# Patient Record
Sex: Female | Born: 1960
Health system: Southern US, Community
[De-identification: ages and names within clinical notes are randomized; demographics above are authoritative.]

## PROBLEM LIST (undated history)

## (undated) DIAGNOSIS — M1990 Unspecified osteoarthritis, unspecified site: Secondary | ICD-10-CM

## (undated) DIAGNOSIS — Z5189 Encounter for other specified aftercare: Secondary | ICD-10-CM

## (undated) DIAGNOSIS — IMO0002 Reserved for concepts with insufficient information to code with codable children: Secondary | ICD-10-CM

## (undated) DIAGNOSIS — D219 Benign neoplasm of connective and other soft tissue, unspecified: Secondary | ICD-10-CM

## (undated) DIAGNOSIS — F419 Anxiety disorder, unspecified: Secondary | ICD-10-CM

## (undated) HISTORY — PX: KNEE ARTHROSCOPY: SUR90

## (undated) HISTORY — DX: Benign neoplasm of connective and other soft tissue, unspecified: D21.9

## (undated) HISTORY — DX: Reserved for concepts with insufficient information to code with codable children: IMO0002

## (undated) HISTORY — PX: WISDOM TOOTH EXTRACTION: SHX21

## (undated) HISTORY — DX: Unspecified osteoarthritis, unspecified site: M19.90

---

## 1994-09-27 DIAGNOSIS — Z5189 Encounter for other specified aftercare: Secondary | ICD-10-CM

## 1994-09-27 HISTORY — DX: Encounter for other specified aftercare: Z51.89

## 1999-02-10 ENCOUNTER — Other Ambulatory Visit: Admission: RE | Admit: 1999-02-10 | Discharge: 1999-02-10 | Payer: Self-pay | Admitting: Obstetrics and Gynecology

## 2000-02-29 ENCOUNTER — Other Ambulatory Visit: Admission: RE | Admit: 2000-02-29 | Discharge: 2000-02-29 | Payer: Self-pay | Admitting: Obstetrics and Gynecology

## 2001-03-31 ENCOUNTER — Other Ambulatory Visit: Admission: RE | Admit: 2001-03-31 | Discharge: 2001-03-31 | Payer: Self-pay | Admitting: Obstetrics and Gynecology

## 2002-05-15 ENCOUNTER — Other Ambulatory Visit: Admission: RE | Admit: 2002-05-15 | Discharge: 2002-05-15 | Payer: Self-pay | Admitting: Obstetrics and Gynecology

## 2003-06-18 ENCOUNTER — Other Ambulatory Visit: Admission: RE | Admit: 2003-06-18 | Discharge: 2003-06-18 | Payer: Self-pay | Admitting: Obstetrics and Gynecology

## 2004-08-19 ENCOUNTER — Other Ambulatory Visit: Admission: RE | Admit: 2004-08-19 | Discharge: 2004-08-19 | Payer: Self-pay | Admitting: Obstetrics and Gynecology

## 2005-10-21 ENCOUNTER — Other Ambulatory Visit: Admission: RE | Admit: 2005-10-21 | Discharge: 2005-10-21 | Payer: Self-pay | Admitting: Obstetrics & Gynecology

## 2006-12-08 ENCOUNTER — Other Ambulatory Visit: Admission: RE | Admit: 2006-12-08 | Discharge: 2006-12-08 | Payer: Self-pay | Admitting: Obstetrics and Gynecology

## 2007-02-01 ENCOUNTER — Encounter: Admission: RE | Admit: 2007-02-01 | Discharge: 2007-02-01 | Payer: Self-pay | Admitting: Obstetrics and Gynecology

## 2007-04-07 ENCOUNTER — Encounter: Payer: Self-pay | Admitting: Internal Medicine

## 2007-04-07 ENCOUNTER — Ambulatory Visit: Payer: Self-pay | Admitting: Internal Medicine

## 2007-04-07 DIAGNOSIS — F3289 Other specified depressive episodes: Secondary | ICD-10-CM | POA: Insufficient documentation

## 2007-04-07 DIAGNOSIS — F329 Major depressive disorder, single episode, unspecified: Secondary | ICD-10-CM | POA: Insufficient documentation

## 2008-02-27 ENCOUNTER — Other Ambulatory Visit: Admission: RE | Admit: 2008-02-27 | Discharge: 2008-02-27 | Payer: Self-pay | Admitting: Obstetrics & Gynecology

## 2008-03-01 ENCOUNTER — Encounter: Payer: Self-pay | Admitting: Internal Medicine

## 2008-03-01 ENCOUNTER — Encounter: Admission: RE | Admit: 2008-03-01 | Discharge: 2008-03-01 | Payer: Self-pay | Admitting: Obstetrics and Gynecology

## 2010-06-11 ENCOUNTER — Encounter: Admission: RE | Admit: 2010-06-11 | Discharge: 2010-06-11 | Payer: Self-pay | Admitting: Obstetrics & Gynecology

## 2010-09-27 HISTORY — PX: OTHER SURGICAL HISTORY: SHX169

## 2012-02-23 ENCOUNTER — Encounter (HOSPITAL_COMMUNITY): Payer: Self-pay | Admitting: Pharmacist

## 2012-02-26 HISTORY — PX: LAPAROSCOPIC TOTAL HYSTERECTOMY: SUR800

## 2012-02-26 HISTORY — PX: ABDOMINAL HYSTERECTOMY: SHX81

## 2012-02-28 ENCOUNTER — Inpatient Hospital Stay (HOSPITAL_COMMUNITY): Admission: RE | Admit: 2012-02-28 | Payer: Self-pay | Source: Ambulatory Visit

## 2012-03-01 ENCOUNTER — Encounter (HOSPITAL_COMMUNITY): Payer: Self-pay

## 2012-03-01 ENCOUNTER — Encounter (HOSPITAL_COMMUNITY)
Admission: RE | Admit: 2012-03-01 | Discharge: 2012-03-01 | Disposition: A | Discharge status: Home / Self Care | Payer: 59 | Source: Ambulatory Visit | Attending: Obstetrics & Gynecology | Admitting: Obstetrics & Gynecology

## 2012-03-01 HISTORY — DX: Anxiety disorder, unspecified: F41.9

## 2012-03-01 HISTORY — DX: Encounter for other specified aftercare: Z51.89

## 2012-03-01 LAB — CBC
HCT: 35 % — ABNORMAL LOW (ref 36.0–46.0)
Hemoglobin: 11.7 g/dL — ABNORMAL LOW (ref 12.0–15.0)
MCH: 29.2 pg (ref 26.0–34.0)
MCHC: 33.4 g/dL (ref 30.0–36.0)
MCV: 87.3 fL (ref 78.0–100.0)

## 2012-03-01 NOTE — Patient Instructions (Addendum)
   Your procedure is scheduled on: Monday June 10th  Enter through the Main Entrance of [facility] at:6am Pick up the phone at the desk and dial [phone] and inform us of your arrival.  Please call this number if you have any problems the morning of surgery: [phone]  Remember: Do not eat food after midnight: Sunday Do not drink clear liquids after: midnight Sunday Take these medicines the morning of surgery with a SIP OF WATER:  Do not wear jewelry, make-up, or FINGER nail polish Do not wear lotions, powders, perfumes or deodorant. Do not shave 48 hours prior to surgery. Do not bring valuables to the hospital. Contacts, dentures or bridgework may not be worn into surgery.  Leave suitcase in the car. After Surgery it may be brought to your room. For patients being admitted to the hospital, checkout time is 11:00am the day of discharge.  Patients discharged on the day of surgery will not be allowed to drive home.     Remember to use your hibiclens as instructed.Please shower with 1/2 bottle the evening before your surgery and the other 1/2 bottle the morning of surgery. Neck down avoiding private area.

## 2012-03-05 MED ORDER — CLINDAMYCIN PHOSPHATE 900 MG/50ML IV SOLN
900.0000 mg | INTRAVENOUS | Status: DC
  Filled 2012-03-05: qty 50

## 2012-03-05 MED ORDER — CIPROFLOXACIN IN D5W 400 MG/200ML IV SOLN
400.0000 mg | INTRAVENOUS | Status: DC
  Filled 2012-03-05: qty 200

## 2012-03-06 ENCOUNTER — Ambulatory Visit (HOSPITAL_COMMUNITY): Payer: 59 | Admitting: Anesthesiology

## 2012-03-06 ENCOUNTER — Ambulatory Visit (HOSPITAL_COMMUNITY)
Admission: RE | Admit: 2012-03-06 | Discharge: 2012-03-07 | Disposition: A | Discharge status: Home / Self Care | Payer: 59 | Source: Ambulatory Visit | Attending: Obstetrics & Gynecology | Admitting: Obstetrics & Gynecology

## 2012-03-06 ENCOUNTER — Encounter (HOSPITAL_COMMUNITY): Payer: Self-pay | Admitting: *Deleted

## 2012-03-06 ENCOUNTER — Encounter (HOSPITAL_COMMUNITY): Payer: Self-pay | Admitting: Anesthesiology

## 2012-03-06 ENCOUNTER — Encounter (HOSPITAL_COMMUNITY): Payer: Self-pay

## 2012-03-06 ENCOUNTER — Encounter (HOSPITAL_COMMUNITY)
Admission: RE | Disposition: A | Discharge status: Home / Self Care | Payer: Self-pay | Source: Ambulatory Visit | Attending: Obstetrics & Gynecology

## 2012-03-06 DIAGNOSIS — N8000 Endometriosis of the uterus, unspecified: Secondary | ICD-10-CM | POA: Insufficient documentation

## 2012-03-06 DIAGNOSIS — Y9210 Unspecified residential institution as the place of occurrence of the external cause: Secondary | ICD-10-CM | POA: Insufficient documentation

## 2012-03-06 DIAGNOSIS — Z01818 Encounter for other preprocedural examination: Secondary | ICD-10-CM | POA: Insufficient documentation

## 2012-03-06 DIAGNOSIS — N838 Other noninflammatory disorders of ovary, fallopian tube and broad ligament: Secondary | ICD-10-CM | POA: Insufficient documentation

## 2012-03-06 DIAGNOSIS — IMO0002 Reserved for concepts with insufficient information to code with codable children: Secondary | ICD-10-CM | POA: Insufficient documentation

## 2012-03-06 DIAGNOSIS — Z01812 Encounter for preprocedural laboratory examination: Secondary | ICD-10-CM | POA: Insufficient documentation

## 2012-03-06 DIAGNOSIS — D251 Intramural leiomyoma of uterus: Secondary | ICD-10-CM | POA: Diagnosis present

## 2012-03-06 DIAGNOSIS — D649 Anemia, unspecified: Secondary | ICD-10-CM | POA: Insufficient documentation

## 2012-03-06 DIAGNOSIS — N920 Excessive and frequent menstruation with regular cycle: Secondary | ICD-10-CM | POA: Insufficient documentation

## 2012-03-06 HISTORY — PX: CYSTOSCOPY: SHX5120

## 2012-03-06 LAB — CBC
HCT: 36 % (ref 36.0–46.0)
Hemoglobin: 12.1 g/dL (ref 12.0–15.0)
MCH: 29.5 pg (ref 26.0–34.0)
MCV: 87.8 fL (ref 78.0–100.0)
Platelets: 211 10*3/uL (ref 150–400)
RBC: 4.1 MIL/uL (ref 3.87–5.11)
WBC: 9.7 10*3/uL (ref 4.0–10.5)

## 2012-03-06 LAB — PREGNANCY, URINE: Preg Test, Ur: NEGATIVE

## 2012-03-06 SURGERY — ROBOTIC ASSISTED TOTAL HYSTERECTOMY
Anesthesia: Epidural | Site: Bladder | Wound class: Clean Contaminated

## 2012-03-06 MED ORDER — NEOSTIGMINE METHYLSULFATE 1 MG/ML IJ SOLN
INTRAMUSCULAR | Status: AC
  Filled 2012-03-06: qty 10

## 2012-03-06 MED ORDER — DEXAMETHASONE SODIUM PHOSPHATE 10 MG/ML IJ SOLN
INTRAMUSCULAR | Status: DC | PRN
  Administered 2012-03-06: 10 mg via INTRAVENOUS

## 2012-03-06 MED ORDER — ALUM & MAG HYDROXIDE-SIMETH 200-200-20 MG/5ML PO SUSP
30.0000 mL | ORAL | Status: DC | PRN
  Filled 2012-03-06: qty 30

## 2012-03-06 MED ORDER — ONDANSETRON HCL 4 MG/2ML IJ SOLN
INTRAMUSCULAR | Status: AC
  Filled 2012-03-06: qty 2

## 2012-03-06 MED ORDER — LIDOCAINE HCL (CARDIAC) 20 MG/ML IV SOLN
INTRAVENOUS | Status: DC | PRN
  Administered 2012-03-06: 80 mg via INTRAVENOUS

## 2012-03-06 MED ORDER — NEOSTIGMINE METHYLSULFATE 1 MG/ML IJ SOLN
INTRAMUSCULAR | Status: DC | PRN
  Administered 2012-03-06: 4 mg via INTRAVENOUS

## 2012-03-06 MED ORDER — ROPIVACAINE HCL 5 MG/ML IJ SOLN
INTRAMUSCULAR | Status: AC
  Filled 2012-03-06: qty 60

## 2012-03-06 MED ORDER — INDIGOTINDISULFONATE SODIUM 8 MG/ML IJ SOLN
INTRAMUSCULAR | Status: DC | PRN
  Administered 2012-03-06: 40 mg via INTRAVENOUS

## 2012-03-06 MED ORDER — STERILE WATER FOR IRRIGATION IR SOLN
Status: DC | PRN
  Administered 2012-03-06: 1000 mL via INTRAVESICAL

## 2012-03-06 MED ORDER — MIDAZOLAM HCL 5 MG/5ML IJ SOLN
INTRAMUSCULAR | Status: DC | PRN
  Administered 2012-03-06: 2 mg via INTRAVENOUS

## 2012-03-06 MED ORDER — CEFAZOLIN SODIUM-DEXTROSE 2-3 GM-% IV SOLR
2.0000 g | Freq: Three times a day (TID) | INTRAVENOUS | Status: DC
  Administered 2012-03-06: 2 g via INTRAVENOUS
  Filled 2012-03-06 (×6): qty 50

## 2012-03-06 MED ORDER — KETOROLAC TROMETHAMINE 30 MG/ML IJ SOLN
15.0000 mg | Freq: Once | INTRAMUSCULAR | Status: AC | PRN
  Administered 2012-03-06: 30 mg via INTRAVENOUS

## 2012-03-06 MED ORDER — FENTANYL CITRATE 0.05 MG/ML IJ SOLN
25.0000 ug | INTRAMUSCULAR | Status: DC | PRN
  Administered 2012-03-06: 25 ug via INTRAVENOUS

## 2012-03-06 MED ORDER — CEFAZOLIN SODIUM 1-5 GM-% IV SOLN
INTRAVENOUS | Status: AC
  Filled 2012-03-06: qty 50

## 2012-03-06 MED ORDER — PROPOFOL 10 MG/ML IV EMUL
INTRAVENOUS | Status: DC | PRN
  Administered 2012-03-06: 150 mg via INTRAVENOUS

## 2012-03-06 MED ORDER — MENTHOL 3 MG MT LOZG
1.0000 | LOZENGE | OROMUCOSAL | Status: DC | PRN
  Filled 2012-03-06: qty 9

## 2012-03-06 MED ORDER — TEMAZEPAM 15 MG PO CAPS
15.0000 mg | ORAL_CAPSULE | Freq: Every evening | ORAL | Status: DC | PRN
  Administered 2012-03-06: 15 mg via ORAL
  Filled 2012-03-06: qty 1

## 2012-03-06 MED ORDER — LACTATED RINGERS IR SOLN
Status: DC | PRN
  Administered 2012-03-06: 3000 mL

## 2012-03-06 MED ORDER — LACTATED RINGERS IV SOLN
INTRAVENOUS | Status: DC
  Administered 2012-03-06: 50 mL/h via INTRAVENOUS
  Administered 2012-03-06: 10:00:00 via INTRAVENOUS

## 2012-03-06 MED ORDER — INDIGOTINDISULFONATE SODIUM 8 MG/ML IJ SOLN
INTRAMUSCULAR | Status: AC
  Filled 2012-03-06: qty 5

## 2012-03-06 MED ORDER — ROCURONIUM BROMIDE 50 MG/5ML IV SOLN
INTRAVENOUS | Status: AC
  Filled 2012-03-06: qty 2

## 2012-03-06 MED ORDER — GLYCOPYRROLATE 0.2 MG/ML IJ SOLN
INTRAMUSCULAR | Status: AC
  Filled 2012-03-06: qty 3

## 2012-03-06 MED ORDER — DEXTROSE-NACL 5-0.45 % IV SOLN
INTRAVENOUS | Status: DC
  Administered 2012-03-06: 18:00:00 via INTRAVENOUS

## 2012-03-06 MED ORDER — PANTOPRAZOLE SODIUM 40 MG IV SOLR
40.0000 mg | Freq: Every day | INTRAVENOUS | Status: DC
  Administered 2012-03-06: 40 mg via INTRAVENOUS
  Filled 2012-03-06 (×2): qty 40

## 2012-03-06 MED ORDER — DEXAMETHASONE SODIUM PHOSPHATE 10 MG/ML IJ SOLN
INTRAMUSCULAR | Status: AC
  Filled 2012-03-06: qty 1

## 2012-03-06 MED ORDER — SIMETHICONE 80 MG PO CHEW: 80.0000 mg | CHEWABLE_TABLET | Freq: Four times a day (QID) | ORAL | Status: DC | PRN

## 2012-03-06 MED ORDER — ACETAMINOPHEN 325 MG PO TABS: 650.0000 mg | ORAL_TABLET | ORAL | Status: DC | PRN

## 2012-03-06 MED ORDER — ROPIVACAINE HCL 5 MG/ML IJ SOLN
INTRAMUSCULAR | Status: DC | PRN
  Administered 2012-03-06: 60 mL

## 2012-03-06 MED ORDER — MIDAZOLAM HCL 2 MG/2ML IJ SOLN
INTRAMUSCULAR | Status: AC
  Filled 2012-03-06: qty 2

## 2012-03-06 MED ORDER — GLYCOPYRROLATE 0.2 MG/ML IJ SOLN
INTRAMUSCULAR | Status: DC | PRN
  Administered 2012-03-06: 0.1 mg via INTRAVENOUS
  Administered 2012-03-06: .8 mg via INTRAVENOUS

## 2012-03-06 MED ORDER — MORPHINE SULFATE 4 MG/ML IJ SOLN
1.0000 mg | INTRAMUSCULAR | Status: DC | PRN
  Administered 2012-03-06 (×2): 2 mg via INTRAVENOUS
  Filled 2012-03-06: qty 1

## 2012-03-06 MED ORDER — PROPOFOL 10 MG/ML IV EMUL
INTRAVENOUS | Status: AC
  Filled 2012-03-06: qty 20

## 2012-03-06 MED ORDER — MEPERIDINE HCL 25 MG/ML IJ SOLN: 6.2500 mg | INTRAMUSCULAR | Status: DC | PRN

## 2012-03-06 MED ORDER — ARTIFICIAL TEARS OP OINT
TOPICAL_OINTMENT | OPHTHALMIC | Status: DC | PRN
  Administered 2012-03-06: 1 via OPHTHALMIC

## 2012-03-06 MED ORDER — LIDOCAINE HCL (CARDIAC) 20 MG/ML IV SOLN
INTRAVENOUS | Status: AC
  Filled 2012-03-06: qty 5

## 2012-03-06 MED ORDER — OXYCODONE-ACETAMINOPHEN 5-325 MG PO TABS: 1.0000 | ORAL_TABLET | ORAL | Status: DC | PRN

## 2012-03-06 MED ORDER — FENTANYL CITRATE 0.05 MG/ML IJ SOLN
INTRAMUSCULAR | Status: AC
  Filled 2012-03-06: qty 5

## 2012-03-06 MED ORDER — KETOROLAC TROMETHAMINE 30 MG/ML IJ SOLN: 30.0000 mg | Freq: Four times a day (QID) | INTRAMUSCULAR | Status: DC

## 2012-03-06 MED ORDER — FENTANYL CITRATE 0.05 MG/ML IJ SOLN
INTRAMUSCULAR | Status: DC | PRN
  Administered 2012-03-06: 50 ug via INTRAVENOUS
  Administered 2012-03-06: 100 ug via INTRAVENOUS
  Administered 2012-03-06 (×2): 50 ug via INTRAVENOUS

## 2012-03-06 MED ORDER — BUPROPION HCL ER (SR) 100 MG PO TB12
200.0000 mg | ORAL_TABLET | Freq: Two times a day (BID) | ORAL | Status: DC
  Filled 2012-03-06 (×3): qty 2

## 2012-03-06 MED ORDER — FENTANYL CITRATE 0.05 MG/ML IJ SOLN
INTRAMUSCULAR | Status: AC
  Filled 2012-03-06: qty 2

## 2012-03-06 MED ORDER — ACETAMINOPHEN 10 MG/ML IV SOLN
1000.0000 mg | Freq: Once | INTRAVENOUS | Status: AC
  Administered 2012-03-06: 1000 mg via INTRAVENOUS
  Filled 2012-03-06: qty 100

## 2012-03-06 MED ORDER — ONDANSETRON HCL 4 MG/2ML IJ SOLN
INTRAMUSCULAR | Status: DC | PRN
  Administered 2012-03-06: 4 mg via INTRAVENOUS

## 2012-03-06 MED ORDER — ONDANSETRON HCL 4 MG/2ML IJ SOLN: 4.0000 mg | Freq: Once | INTRAMUSCULAR | Status: DC | PRN

## 2012-03-06 MED ORDER — SODIUM CHLORIDE 0.9 % IJ SOLN
INTRAMUSCULAR | Status: DC | PRN
  Administered 2012-03-06: 60 mL

## 2012-03-06 MED ORDER — KETOROLAC TROMETHAMINE 30 MG/ML IJ SOLN
INTRAMUSCULAR | Status: AC
  Filled 2012-03-06: qty 1

## 2012-03-06 MED ORDER — KETOROLAC TROMETHAMINE 30 MG/ML IJ SOLN
30.0000 mg | Freq: Four times a day (QID) | INTRAMUSCULAR | Status: DC
  Administered 2012-03-06: 30 mg via INTRAVENOUS
  Filled 2012-03-06: qty 1

## 2012-03-06 MED ORDER — ROCURONIUM BROMIDE 100 MG/10ML IV SOLN
INTRAVENOUS | Status: DC | PRN
  Administered 2012-03-06: 50 mg via INTRAVENOUS
  Administered 2012-03-06 (×2): 10 mg via INTRAVENOUS

## 2012-03-06 SURGICAL SUPPLY — 72 items
ADH SKN CLS APL DERMABOND .7 (GAUZE/BANDAGES/DRESSINGS) ×2
APL SKNCLS STERI-STRIP NONHPOA (GAUZE/BANDAGES/DRESSINGS)
BAG URINE DRAINAGE (UROLOGICAL SUPPLIES) ×3 IMPLANT
BARRIER ADHS 3X4 INTERCEED (GAUZE/BANDAGES/DRESSINGS) ×3 IMPLANT
BENZOIN TINCTURE PRP APPL 2/3 (GAUZE/BANDAGES/DRESSINGS) ×1 IMPLANT
BRR ADH 4X3 ABS CNTRL BYND (GAUZE/BANDAGES/DRESSINGS) ×2
CABLE HIGH FREQUENCY MONO STRZ (ELECTRODE) ×3 IMPLANT
CATH FOLEY 3WAY  5CC 16FR (CATHETERS) ×1
CATH FOLEY 3WAY 5CC 16FR (CATHETERS) ×2 IMPLANT
CHLORAPREP W/TINT 26ML (MISCELLANEOUS) ×3 IMPLANT
CLOTH BEACON ORANGE TIMEOUT ST (SAFETY) ×3 IMPLANT
CONT PATH 16OZ SNAP LID 3702 (MISCELLANEOUS) ×3 IMPLANT
COVER MAYO STAND STRL (DRAPES) ×3 IMPLANT
COVER TABLE BACK 60X90 (DRAPES) ×6 IMPLANT
COVER TIP SHEARS 8 DVNC (MISCELLANEOUS) ×2 IMPLANT
COVER TIP SHEARS 8MM DA VINCI (MISCELLANEOUS) ×1
DECANTER SPIKE VIAL GLASS SM (MISCELLANEOUS) ×3 IMPLANT
DERMABOND ADVANCED (GAUZE/BANDAGES/DRESSINGS) ×1
DERMABOND ADVANCED .7 DNX12 (GAUZE/BANDAGES/DRESSINGS) ×2 IMPLANT
DRAPE HUG U DISPOSABLE (DRAPE) ×3 IMPLANT
DRAPE LG THREE QUARTER DISP (DRAPES) ×6 IMPLANT
DRAPE MONITOR DA VINCI (DRAPE) IMPLANT
DRAPE WARM FLUID 44X44 (DRAPE) ×3 IMPLANT
ELECT REM PT RETURN 9FT ADLT (ELECTROSURGICAL) ×3
ELECTRODE REM PT RTRN 9FT ADLT (ELECTROSURGICAL) ×2 IMPLANT
EVACUATOR SMOKE 8.L (FILTER) ×3 IMPLANT
GAUZE VASELINE 3X9 (GAUZE/BANDAGES/DRESSINGS) IMPLANT
GLOVE BIOGEL PI IND STRL 6.5 (GLOVE) ×6 IMPLANT
GLOVE BIOGEL PI IND STRL 7.0 (GLOVE) ×4 IMPLANT
GLOVE BIOGEL PI INDICATOR 6.5 (GLOVE) ×6
GLOVE BIOGEL PI INDICATOR 7.0 (GLOVE) ×2
GLOVE ECLIPSE 6.0 STRL STRAW (GLOVE) ×8 IMPLANT
GLOVE ECLIPSE 6.5 STRL STRAW (GLOVE) ×9 IMPLANT
GLOVE SURG SS PI 6.0 STRL IVOR (GLOVE) ×6 IMPLANT
GOWN STRL REIN XL XLG (GOWN DISPOSABLE) ×20 IMPLANT
KIT ACCESSORY DA VINCI DISP (KITS) ×1
KIT ACCESSORY DVNC DISP (KITS) ×2 IMPLANT
KIT DISP ACCESSORY 4 ARM (KITS) IMPLANT
NEEDLE INSUFFLATION 14GA 120MM (NEEDLE) ×3 IMPLANT
OCCLUDER COLPOPNEUMO (BALLOONS) ×1 IMPLANT
PACK LAVH (CUSTOM PROCEDURE TRAY) ×3 IMPLANT
PAD OB MATERNITY 4.3X12.25 (PERSONAL CARE ITEMS) ×3 IMPLANT
PAD PREP 24X48 CUFFED NSTRL (MISCELLANEOUS) ×6 IMPLANT
PLUG CATH AND CAP STER (CATHETERS) ×3 IMPLANT
PROTECTOR NERVE ULNAR (MISCELLANEOUS) ×6 IMPLANT
SET CYSTO W/LG BORE CLAMP LF (SET/KITS/TRAYS/PACK) ×3 IMPLANT
SET IRRIG TUBING LAPAROSCOPIC (IRRIGATION / IRRIGATOR) ×3 IMPLANT
SOLUTION ELECTROLUBE (MISCELLANEOUS) ×3 IMPLANT
SPONGE LAP 18X18 X RAY DECT (DISPOSABLE) IMPLANT
STRIP CLOSURE SKIN 1/4X4 (GAUZE/BANDAGES/DRESSINGS) IMPLANT
SUT VIC AB 0 CT1 27 (SUTURE) ×6
SUT VIC AB 0 CT1 27XBRD ANBCTR (SUTURE) ×4 IMPLANT
SUT VIC AB 2-0 SH 27 (SUTURE) ×6
SUT VIC AB 2-0 SH 27XBRD (SUTURE) IMPLANT
SUT VICRYL 0 UR6 27IN ABS (SUTURE) ×3 IMPLANT
SUT VICRYL RAPIDE 4/0 PS 2 (SUTURE) ×2 IMPLANT
SUT VLOC 180 0 9IN  GS21 (SUTURE) ×1
SUT VLOC 180 0 9IN GS21 (SUTURE) IMPLANT
SYR 50ML LL SCALE MARK (SYRINGE) ×3 IMPLANT
SYSTEM CONVERTIBLE TROCAR (TROCAR) IMPLANT
TIP UTERINE 5.1X6CM LAV DISP (MISCELLANEOUS) IMPLANT
TIP UTERINE 6.7X10CM GRN DISP (MISCELLANEOUS) IMPLANT
TIP UTERINE 6.7X6CM WHT DISP (MISCELLANEOUS) IMPLANT
TIP UTERINE 6.7X8CM BLUE DISP (MISCELLANEOUS) ×2 IMPLANT
TOWEL OR 17X24 6PK STRL BLUE (TOWEL DISPOSABLE) ×6 IMPLANT
TROCAR DISP BLADELESS 8 DVNC (TROCAR) ×2 IMPLANT
TROCAR DISP BLADELESS 8MM (TROCAR) ×1
TROCAR XCEL NON-BLD 5MMX100MML (ENDOMECHANICALS) ×3 IMPLANT
TROCAR Z-THREAD 12X150 (TROCAR) ×3 IMPLANT
TUBING FILTER THERMOFLATOR (ELECTROSURGICAL) ×3 IMPLANT
WARMER LAPAROSCOPE (MISCELLANEOUS) ×3 IMPLANT
WATER STERILE IRR 1000ML POUR (IV SOLUTION) ×9 IMPLANT

## 2012-03-06 NOTE — Op Note (Signed)
03/06/2012  10:16 AM  PATIENT:  Meagan Harrington  51 y.o. female  PRE-OPERATIVE DIAGNOSIS:  menorrhagia and fibroids  POST-OPERATIVE DIAGNOSIS:  menorrhagia and fibroids, probable adenomyosis  PROCEDURE:  Procedure(s): ROBOTIC ASSISTED TOTAL HYSTERECTOMY BILATERAL SALPINGECTOMY CYSTOSCOPY VAGINAL LACERATION REPAIR  SURGEON:  Jaycey Gens SUZANNE  ASSISTANTS: ROMINE, CYNTHIA   ANESTHESIA:   general, Dr. Rodman Pickle oversaw the case  ESTIMATED BLOOD LOSS:  100cc  BLOOD ADMINISTERED:none   FLUIDS: 1000cc LR  UOP: 150cc clear  SPECIMEN:  Uterus, cervix, bilateral tubes  DISPOSITION OF SPECIMEN:  PATHOLOGY  FINDINGS: enlarged and globular uterus.  The uterus was also very boggy consistent with adenomyosis.  Normal upper abdomen including normal liver edge, gall bladder, and stomach edge.  No evidence of endometriosis or adhesions.  DESCRIPTION OF OPERATION: Patient was taken to the operating room. She is placed in the supine position and the patient was positioned on the beanbag. General endotracheal anesthesia was a minister by the anesthesia staff without difficulty. SCDs were on her lower extremities and functioning properly. The arms were tucked by the side.  Legs are positioned in the low lithotomy position in [place] stirrups. The beanbag was inflated with good support around the neck and shoulders. A timeout was performed. Chlor prep was used to prep the abdomen and Betadine was used to prep the inner thighs perineum and vagina x3. After 3 minutes past the patient was draped in a normal standard fashion.  Attention was turned to the vagina. A heavy weighted speculum was placed in the vagina. The anterior lip of the cervix was grasped with single-tooth tenaculum. The uterus sounded to 9 cm. The RUMI uterine manipulator was obtained. A #8 disposable tip is attached to this manipulator. Then a vaginal occlusive device is placed over the manipulator and finally a medium KOH ring was  placed. The cervix is dilated up to a #21 and then the RUMI uterine manipulator was passed through the cervical canal to the fundus. There was a good fit around the cervix. The disposable tip was inflated with 10 cc of normal saline. This allowed for good manipulation of the uterus. A vaginal occlusive device was kept deflated this point. The tenaculum was removed from the anterior lip of the cervix. In the bivalve speculum was removed. A Foley catheter was placed to straight drain. The legs were positioned back in the low lithotomy position.  Attention was turned to the abdomen. A ropivacaine mixture (0.5% mixed one-to-one with normal saline) was used of the procedure. Initially this was used to anesthetize the skin the need the umbilicus. A 10 mm skin incision was made at the umbilicus.  A Veress needle with an open stopcock was then passed through the abdominal walls.  Before any insufflation was performed, a syringe of normal saline was attached to the Veress needle.  An aspiration was performed and a small amount of blood was present.  I was not convinced that the needle was intraperitoneal.  The patient was completely stable at this time.  The Veress needle was remove and the syringe of normal saline was removed from the Veress needle.  A second attempt was made the pass the needled through the abdominal wall layers.  This time, the placement proceeded as normal and the peritoneum was felt as a pop when it was passed through.  The syringe of normal saline was reattached.  Then an aspiration was performed and no blood or fluid was noted.  Was injected easily into the needle and a  second aspiration was performed. No blood fluid or saline was noted. CO2 gas was attached the needle and under low flows a pneumoperitoneum was achieved. Then the skin was anesthetized 2 cm beneath the ribs on the midclavicular line on the left side. A 5 mm skin incision was made with #11 blade. 5 mm non-bladed trocar port with the  laparoscope attached were passed through the abdominal wall layers. The trocar was removed. The laparoscope was then used to visualize the abdominal wall anteriorly on the side. There is no evidence of active bleeding. The light from the laparoscopy was then used to transilluminated the abdominal walls there was area beneath the umbilicus where tracking from the Veress needle could be seen. There is a small bruise less than a nickel in size with no active bleeding. It was completely safe to continue the procedure. Then under direct visualization from the 5 mm laparoscope, after the pneumoperitoneum was achieved, a #11 bladed trocar port passed to the umbilical incision without difficulty. The port placement sites were chosen 10 cm to the right and left of the umbilicus. These were to be used as a #1 and #2 robotic arm ports. The skin was anesthetized with ropivacaine mixture and 8 mm skin incisions were made with a #11 blade. An non-bladed trochars and ports were passed under direct visualization of the laparoscope. The trochars were removed. The patient's table was then placed on the floor this point and in Trendelenburg until the bowel was good up out of the pelvis. The robot was undocked a normal standard fashion on the right side. The #1 arm was used for endoscopic scissors with monopolar cautery attached and then #2 arm was used for PK [place] with bipolar cautery attached.  At this point athetoid from the table and moved to the console. The ureters were noted to be peristalsing bilaterally. I had previously discussed with the patient her desire to keep the ovaries and as a result recommended removal fallopian tubes. With attention to the left side the left fallopian tube was cauterized along the mesosalpinx and way up to the cornea of the uterus.  Then the utero-ovarian pedicle was serially clamped cauterized and incised. The round ligament on the left side was serially clamped cauterized and incised. The  inferiorly for the broad ligament was opened and the anterior peritoneum was taken down to the cervix were approximately the internal os of the cervix we present. The posterior peritoneum was then taken down to the level of the uterosacral ligament. The beginning of the bladder flap was created by dissecting in the plane with the pubovesicocervical fascia was located and pushing the bladder down below the level of the KOH ring. The uterine artery on the left side was then skeletonized and at the level of the KOH ring was serially clamped cauterized and incised until excellent hemostasis was noted. Although surgery went quite well the uterus was very boggy and almost anytime the uterus was touched there is a small amount of bleeding which required cautery. I feel the pathology for this uterus specimen will ultimately show adenomyosis.  And attention was turned to the right side with the uterus on stretch to the left. In a similar fashion the fallopian tube was freed from the mesosalpinx using bipolar cautery. The utero-ovarian pedicle was then serially clamped cauterized and incised. Then the round ligament was serially clamped cauterized and incised on the right side. The inferiorly for the broad ligament was then opened and the anterior peritoneum was taken  down to the level of prior dissection. The bladder flap was created on the right side by dissecting in the pubovesicocervical plane again and bringing the bladder down below the level of the KOH ring. The posterior peritoneum was then incised down to the level of the uterosacral ligament. Finally the uterine artery was skeletonized on the right side and at the level of the KOH ring it was serially clamped cauterized and incised. Then the cautery of the uterine arteries was done on each side in a sure that the vaginal assistant was pushing the uterus and cephalad as possible. This allowed the ureters to fallaway from this dissection and minimize injury.   At  this point the uterus was devascularized. The colpotomy was started in the midline and with made using the open blade of the monopolar scissors. This was taken around the cervix and a clockwise fashion. Once the colpotomy was completed the uterus, cervix, and tubes were delivered to the vagina. One tube did come off of the specimen and was found and deliver throughout the side ports. The specimen was sent completely together to pathology.  Then using a V. lock suture a starting in the right corner of the vaginal incision, the vagina was closed by using a running stitch that incorporated both the anterior peritoneum and anterior vaginal mucosa as well as the posterior peritoneum and posterior vaginal mucosa. This was taken all the way across to the opposite angle to final sutures being placed for the V. lock suture was cut flush with the vagina. The needle as parked on the right sidewall before being removed the midline port well being visualized on the left side with a #8 laparoscope. At this point the pelvis was irrigated. No active bleeding was noted. The ureters were noted to be peristalsing bilaterally. Abdomen was surveyed again.  In Interceed was placed across vaginal cuff and the robot was undocked. The patient was taken out of Trendelenburg positioning. The left upper quadrant and #1 and #2 arm ports were all removed under direct visualization of the laparoscope. The pneumoperitoneum was relieved the CRNA give the patient several deep breaths to try and get any gas the abdomen. The umbilical incision was closed at the fascial layer with figure-of-eight suture of #0 Vicryl. All incisions were cleansed and closed with subcuticular stitch of #3-0 Vicryl.  Indigo carmine was given by the CRNA intravenously. Foley catheter was removed. The legs were elevated and a cystoscopy was performed. The done of the bladder appeared normal there is no evidence of cautery effect or sutures in uterine peristalsis with  blue dye coming from each ureteral orifice was noted. The irrigant was removed from the bladder and the Foley catheter was not replaced.   Then a sponge stick was used for swipe at the vaginal cuff. No bleeding was noted at the cuff but there was a small left vaginal sidewall laceration and perineal laceration. These were bleeding and required sutures. The left side was closed with a 3-0 suture of #0 Vicryl in a running interlocking fashion. This was hemostatic. Then the perineal laceration was closed with a #3-0 Vicryl in a running interlocking fashion to the stitches at the introitus. Then the external perineal skin was closed at the subcuticular stitch of 3-0 Vicryl. Excellent hemostasis was noted this point.  All instruments were removed from the vagina. The patient's legs were taken out of Trendelenburg positioning and she is placed back in the supine position. Sponge, laps, needle, initially counts were correct x2.  Patient did very well during the surgery and his x-ray without difficulty and taken to recovery in stable condition.   COUNTS:  YES  PLAN OF CARE: Transfer to PACU

## 2012-03-06 NOTE — H&P (Signed)
Meagan Harrington is an 51 y.o. female G3P2 MWF with menorrhagia due to uterine fibroids.  She has undergone evaluation with an ultrasound and endometrial biopsy.  The ultrasound shows at least three fibroids with the largest 4.1cm.  She has failed conservative management with oral contraceptives.  She declines ablation or IUD use.  She is here for definitive management.  Pertinent Gynecological History: Menses: irregular and heavy Bleeding: menorrhagia Contraception: micronor DES exposure: denies Blood transfusions: none Sexually transmitted diseases: no past history Previous GYN Procedures: none  Last mammogram: normal Date: 9/11 Last pap: normal Date: 12/12 OB History: G3, P2   Menstrual History: Menarche age: 37 Patient's last menstrual period was 02/23/2012.    Past Medical History  Diagnosis Date  . Encounter for blood transfusion 1996    with childbirth-[place]  . Anxiety   . SVD (spontaneous vaginal delivery)     x 2    Past Surgical History  Procedure Date  . Knee arthroscopy as teen    left  . Fibroma on tongue 2012  . Wisdom tooth extraction     No family history on file.  Social History:  reports that she quit smoking about 23 years ago. Her smoking use included Cigarettes. She has a 5 pack-year smoking history. She has never used smokeless tobacco. She reports that she drinks alcohol. She reports that she does not use illicit drugs.  Allergies: No Known Allergies  Prescriptions prior to admission  Medication Sig Dispense Refill  . buPROPion (WELLBUTRIN SR) 200 MG 12 hr tablet Take 200 mg by mouth 2 (two) times daily.      . Desogestrel-Ethinyl Estradiol (MIRCETTE PO) Take by mouth.        Review of Systems  Constitutional: Negative for fever and chills.  Eyes: Negative for blurred vision.  Respiratory: Negative for cough.   Cardiovascular: Negative for chest pain and palpitations.  Gastrointestinal: Negative for heartburn and nausea.    Genitourinary: Negative for dysuria.  Musculoskeletal: Negative for myalgias.  Skin: Negative for rash.  Neurological: Negative for dizziness and headaches.  Endo/Heme/Allergies: Does not bruise/bleed easily.  Psychiatric/Behavioral: Negative for depression.    Blood pressure 118/80, pulse 67, temperature 98.1 F (36.7 C), temperature source Oral, resp. rate 18, last menstrual period 02/23/2012, SpO2 100.00%. Physical Exam  Vitals reviewed. Constitutional: She is oriented to person, place, and time. She appears well-developed and well-nourished.  HENT:  Head: Normocephalic and atraumatic.  Neck: Normal range of motion. Neck supple.  Cardiovascular: Normal rate, regular rhythm and normal heart sounds.   Respiratory: Effort normal and breath sounds normal.  GI: Soft. Bowel sounds are normal. She exhibits no distension.  Genitourinary:       Uterus enlarged and globular consistent with fibroids.  Musculoskeletal: Normal range of motion.  Neurological: She is alert and oriented to person, place, and time.  Skin: Skin is warm and dry.  Psychiatric: She has a normal mood and affect.    Results for orders placed during the hospital encounter of 03/06/12 (from the past 24 hour(s))  PREGNANCY, URINE     Status: Normal   Collection Time   03/06/12  6:15 AM      Component Value Range   Preg Test, Ur NEGATIVE  NEGATIVE     No results found.  Assessment/Plan: 51 year old G3P2 MWF with menorrhagia due to fibroids here for definitive management with robotic assisted TLH/possible BSO.  Procedure discussed with the patient, including positioning during the procedure.  She and her  spouse are here.  All questions answered.  She is ready to proceed.  Valentina Shaggy SUZANNE 03/06/2012, 7:07 AM

## 2012-03-06 NOTE — Addendum Note (Signed)
Addendum  created 03/06/12 1733 by Suella Grove, CRNA   Modules edited:Notes Section

## 2012-03-06 NOTE — Anesthesia Postprocedure Evaluation (Signed)
Anesthesia Post Note  Patient: Meagan Harrington  Procedure(s) Performed: Procedure(s) (LRB): ROBOTIC ASSISTED TOTAL HYSTERECTOMY (N/A) CYSTOSCOPY (N/A)  Anesthesia type: General  Patient location: PACU  Post pain: Pain level controlled  Post assessment: Post-op Vital signs reviewed  Last Vitals:  Filed Vitals:   03/06/12 1145  BP: 118/60  Pulse: 64  Temp: 36.6 C  Resp: 20    Post vital signs: Reviewed  Level of consciousness: sedated  Complications: No apparent anesthesia complications

## 2012-03-06 NOTE — Anesthesia Postprocedure Evaluation (Signed)
  Anesthesia Post-op Note  Patient: Meagan Harrington  Procedure(s) Performed: Procedure(s) (LRB): ROBOTIC ASSISTED TOTAL HYSTERECTOMY (N/A) CYSTOSCOPY (N/A)  Patient Location: [facility] Unit  Anesthesia Type: General  Level of Consciousness: awake  Airway and Oxygen Therapy: Patient Spontanous Breathing  Post-op Pain: none  Post-op Assessment: Post-op Vital signs reviewed  Post-op Vital Signs: Reviewed and stable  Complications: No apparent anesthesia complications

## 2012-03-06 NOTE — Transfer of Care (Signed)
Immediate Anesthesia Transfer of Care Note  Patient: Meagan Harrington  Procedure(s) Performed: Procedure(s) (LRB): ROBOTIC ASSISTED TOTAL HYSTERECTOMY (N/A) CYSTOSCOPY (N/A)  Patient Location: PACU  Anesthesia Type: General  Level of Consciousness: awake, alert  and oriented  Airway & Oxygen Therapy: Patient Spontanous Breathing and Patient connected to nasal cannula oxygen  Post-op Assessment: Report given to PACU RN and Post -op Vital signs reviewed and stable  Post vital signs: Reviewed and stable  Complications: No apparent anesthesia complications

## 2012-03-06 NOTE — OR Nursing (Signed)
Vaginal Laceration performed by Dr. Hyacinth Meeker in OR suite 7.

## 2012-03-06 NOTE — Anesthesia Procedure Notes (Signed)
Procedure Name: Intubation Date/Time: 03/06/2012 7:39 AM Performed by: Graciela Husbands Pre-anesthesia Checklist: Suction available, Emergency Drugs available, Timeout performed, Patient being monitored and Patient identified Patient Re-evaluated:Patient Re-evaluated prior to inductionOxygen Delivery Method: Circle system utilized Preoxygenation: Pre-oxygenation with 100% oxygen Intubation Type: IV induction Ventilation: Mask ventilation without difficulty Laryngoscope Size: Mac and 3 Grade View: Grade I Tube type: Oral Number of attempts: 1 Airway Equipment and Method: Stylet Placement Confirmation: ETT inserted through vocal cords under direct vision,  breath sounds checked- equal and bilateral and positive ETCO2 Secured at: 20 cm Tube secured with: Tape Dental Injury: Teeth and Oropharynx as per pre-operative assessment

## 2012-03-06 NOTE — OR Nursing (Signed)
Robotic assisted bilateral salpingectomy performed by Dr. Hyacinth Meeker in OR room 7.

## 2012-03-06 NOTE — Anesthesia Preprocedure Evaluation (Signed)
Anesthesia Evaluation  Patient identified by MRN, date of birth, ID band Patient awake    Reviewed: Allergy & Precautions, H&P , NPO status , Patient's Chart, lab work & pertinent test results  Airway Mallampati: I TM Distance: >3 FB Neck ROM: full    Dental No notable dental hx. (+) Teeth Intact   Pulmonary neg pulmonary ROS,    Pulmonary exam normal       Cardiovascular negative cardio ROS      Neuro/Psych PSYCHIATRIC DISORDERS Anxiety Depression negative neurological ROS     GI/Hepatic negative GI ROS, Neg liver ROS,   Endo/Other  negative endocrine ROS  Renal/GU negative Renal ROS  negative genitourinary   Musculoskeletal negative musculoskeletal ROS (+)   Abdominal Normal abdominal exam  (+)   Peds negative pediatric ROS (+)  Hematology negative hematology ROS (+)   Anesthesia Other Findings   Reproductive/Obstetrics negative OB ROS                           Anesthesia Physical Anesthesia Plan  ASA: II  Anesthesia Plan: Epidural and General   Post-op Pain Management:    Induction: Intravenous  Airway Management Planned: Oral ETT  Additional Equipment:   Intra-op Plan:   Post-operative Plan: Extubation in OR  Informed Consent: I have reviewed the patients History and Physical, chart, labs and discussed the procedure including the risks, benefits and alternatives for the proposed anesthesia with the patient or authorized representative who has indicated his/her understanding and acceptance.   Dental Advisory Given  Plan Discussed with: CRNA and Surgeon  Anesthesia Plan Comments:         Anesthesia Quick Evaluation

## 2012-03-06 NOTE — Progress Notes (Signed)
Day of Surgery Procedure(s) (LRB): ROBOTIC ASSISTED TOTAL HYSTERECTOMY (N/A) BILATERAL SALPINGECTOMY CYSTOSCOPY (N/A)  Subjective: Patient reports tolerating PO.  Voiding was slow at first but is improving with each void.  No nausea.  Pain under good control.  Objective: I have reviewed patient's vital signs, intake and output, medications and labs.  General: alert and cooperative Resp: clear to auscultation bilaterally Cardio: regular rate and rhythm, S1, S2 normal, no murmur, click, rub or gallop GI: normal findings: bowel sounds normal, soft, non-tender and mildly distended and incision: clean, dry and intact Extremities: extremities normal, atraumatic, no cyanosis or edema Vaginal Bleeding: minimal   Assessment: s/p Procedure(s) (LRB): ROBOTIC ASSISTED TOTAL HYSTERECTOMY (N/A), BILATERAL SALPINGECTOMY CYSTOSCOPY (N/A): stable and progressing well  Plan: Advance diet Encourage ambulation  LOS: 0 days    Valentina Shaggy SUZANNE 03/06/2012, 6:53 PM

## 2012-03-07 ENCOUNTER — Encounter (HOSPITAL_COMMUNITY): Payer: Self-pay | Admitting: Obstetrics & Gynecology

## 2012-03-07 DIAGNOSIS — D649 Anemia, unspecified: Secondary | ICD-10-CM | POA: Diagnosis present

## 2012-03-07 MED ORDER — TEMAZEPAM 15 MG PO CAPS: 15.0000 mg | ORAL_CAPSULE | Freq: Every evening | ORAL | Status: DC | PRN

## 2012-03-07 MED ORDER — OXYCODONE-ACETAMINOPHEN 5-325 MG PO TABS: 1.0000 | ORAL_TABLET | ORAL | Status: AC | PRN

## 2012-03-07 NOTE — Discharge Instructions (Signed)
Post Op Hysterectomy Instructions Please read the instructions below. Refer to these instructions for the next few weeks. These instructions provide you with general information on caring for yourself after surgery. Your caregiver may also give you specific instructions. While your treatment has been planned according to the most current medical practices available, unavoidable problems sometimes happen. If you have any problems or questions after you leave, please call your caregiver.  HOME CARE INSTRUCTIONS Healing will take time. You will have discomfort, tenderness, swelling and bruising at the operative site for a couple of weeks. This is normal and will get better as time goes on.  Only take over-the-counter or prescription medicines for pain, discomfort or fever as directed by your caregiver.  Do not take aspirin. It can cause bleeding.  Do not drive when taking pain medication.  Follow your caregiver's advice regarding diet, exercise, lifting, driving and general activities.  Resume your usual diet as directed and allowed.  Get plenty of rest and sleep.  Do not douche, use tampons, or have sexual intercourse until your caregiver gives you permission. .  Take your temperature if you feel hot or flushed.  You may shower today when you get home.  No tub bath for one week.   Do not drink alcohol until you are not taking any narcotic pain medications.  Try to have someone home with you for a week or two to help with the household activities.   Be careful over the next two to three weeks with any activities at home that involve lifting, pushing, or pulling.  Listen to your body--if something feels uncomfortable to do, then don't do it. Make sure you and your family understands everything about your operation and recovery.  Walking up stairs is fine. Do not sign any legal documents until you feel normal again.  Keep all your follow-up appointments as recommended by your caregiver.   PLEASE CALL  THE OFFICE IF: There is swelling, redness or increasing pain in the wound area.  Pus is coming from the wound.  You notice a bad smell from the wound or surgical dressing.  You have pain, redness and swelling from the intravenous site.  The wound is breaking open (the edges are not staying together).   You develop pain or bleeding when you urinate.  You develop abnormal vaginal discharge.  You have any type of abnormal reaction or develop an allergy to your medication.  You need stronger pain medication for your pain   SEEK IMMEDIATE MEDICAL CARE: You develop a temperature of 100.5 or higher.  You develop abdominal pain.  You develop chest pain.  You develop shortness of breath.  You pass out.  You develop pain, swelling or redness of your leg.  You develop heavy vaginal bleeding with or without blood clots.   MEDICATIONS: Restart your regular medications BUT wait one week before restarting all vitamins and mineral supplements Use Motrin 800mg every 8 hours for the next several days.  This will help you use less Percocet.  Use the Percocet 5/325 1-2 tabs every 4-6 hours as needed for pain. You may use an over the counter stool softener like Colace or Dulcolax to help with starting a bowel movement.  Start the day after you go home.  Warm liquids, fluids, and ambulation help too.  If you have not had a bowel movement in four days, you need to call the office.  

## 2012-03-07 NOTE — Progress Notes (Signed)
1 Day Post-Op Procedure(s) (LRB): ROBOTIC ASSISTED TOTAL HYSTERECTOMY (N/A) BILATERAL SALPINGECTOMY CYSTOSCOPY (N/A)  Subjective: Patient reports tolerating PO.  Voiding is better.  Minimal pain.  She reports less than scant bleeding.  Objective: I have reviewed patient's vital signs, intake and output, medications and labs.  General: alert and cooperative Resp: clear to auscultation bilaterally Cardio: regular rate and rhythm, S1, S2 normal, no murmur, click, rub or gallop GI: soft, non-tender; bowel sounds normal; no masses,  no organomegaly Extremities: extremities normal, atraumatic, no cyanosis or edema Vaginal Bleeding: none and minimal  Assessment: s/p Procedure(s) (LRB): ROBOTIC ASSISTED TOTAL HYSTERECTOMY (N/A) CYSTOSCOPY (N/A): stable and progressing well  Plan: Discharge home  LOS: 1 day    Valentina Shaggy SUZANNE 03/07/2012, 7:10 AM

## 2012-03-07 NOTE — Discharge Summary (Signed)
Physician Discharge Summary  Patient ID: Meagan Harrington MRN: [id number] DOB/AGE: 05/09/1961 51 y.o.  Admit date: 03/06/2012 Discharge date: 03/07/2012  Admission Diagnoses: Fibroids, menorrhagia, possible adenomyosis, anemia  Discharge Diagnoses:  Active Problems:  * No active hospital problems. *    Discharged Condition: good  Hospital Course: Patient admitted through same day surgery.  Robotic assisted TLH/bilateral salpingectomy/cystoscopy performed.   EBL 100cc.  Patient did well during surgery and with time in PACU.  From PACU, patient transferred to third floor.  VSS/AF throughout hospitalization.  She was able to ambulate and void and tolerate regular diet in PM of POD#0.  By am of POD#1, she has met all criteria for discharge.  Findings:  During surgery, normal upper abdomen and normal pelvis.  Enlarged uterus due to fibroids.  Uterus was also boggy consistent with adenomyosis.  Consults: None  Significant Diagnostic Studies: labs: post op hb 12.1  Treatments: surgery: robotic TLH/bilateral salpingectomy/cystoscopy  Discharge Exam: Blood pressure 127/83, pulse 78, temperature 98.6 F (37 C), temperature source Oral, resp. rate 19, height 5\' 5"  (1.651 m), weight 86.183 kg (190 lb), last menstrual period 02/23/2012, SpO2 98.00%. General appearance: alert and cooperative Resp: clear to auscultation bilaterally Cardio: regular rate and rhythm, S1, S2 normal, no murmur, click, rub or gallop GI: soft, non-tender; bowel sounds normal; no masses,  no organomegaly Extremities: extremities normal, atraumatic, no cyanosis or edema Incision/Wound:clean, dry, intact  Disposition: Final discharge disposition not confirmed   Medication List  As of 03/07/2012  7:13 AM   STOP taking these medications         MIRCETTE PO         TAKE these medications         oxyCODONE-acetaminophen 5-325 MG per tablet   Commonly known as: PERCOCET   Take 1-2 tablets by mouth every 3 (three)  hours as needed (moderate to severe pain (when tolerating fluids)).      temazepam 15 MG capsule   Commonly known as: RESTORIL   Take 1 capsule (15 mg total) by mouth at bedtime as needed for sleep.      WELLBUTRIN SR 200 MG 12 hr tablet   Generic drug: buPROPion   Take 200 mg by mouth 2 (two) times daily.           Follow-up Information    Follow up with Eusebia Grulke, Marda Stalker, MD in 1 week. (Patient already has appt)    Contact information:   [address], [address] [place] [address] [phone]          Signed: Annamaria Boots 03/07/2012, 7:13 AM

## 2013-03-06 ENCOUNTER — Encounter: Payer: Self-pay | Admitting: *Deleted

## 2013-03-19 ENCOUNTER — Ambulatory Visit (INDEPENDENT_AMBULATORY_CARE_PROVIDER_SITE_OTHER): Payer: Managed Care, Other (non HMO) | Admitting: Nurse Practitioner

## 2013-03-19 ENCOUNTER — Ambulatory Visit
Admission: RE | Admit: 2013-03-19 | Discharge: 2013-03-19 | Disposition: A | Discharge status: Home / Self Care | Payer: Managed Care, Other (non HMO) | Source: Ambulatory Visit | Attending: Nurse Practitioner | Admitting: Nurse Practitioner

## 2013-03-19 ENCOUNTER — Encounter: Payer: Self-pay | Admitting: Nurse Practitioner

## 2013-03-19 ENCOUNTER — Other Ambulatory Visit: Payer: Self-pay | Admitting: Nurse Practitioner

## 2013-03-19 VITALS — BP 120/76 | HR 68 | Resp 18 | Ht 65.0 in | Wt 188.0 lb

## 2013-03-19 DIAGNOSIS — Z1231 Encounter for screening mammogram for malignant neoplasm of breast: Secondary | ICD-10-CM

## 2013-03-19 DIAGNOSIS — Z01419 Encounter for gynecological examination (general) (routine) without abnormal findings: Secondary | ICD-10-CM

## 2013-03-19 DIAGNOSIS — Z0000 Encounter for general adult medical examination without abnormal findings: Secondary | ICD-10-CM

## 2013-03-19 NOTE — Progress Notes (Signed)
52 y.o. [id number] Married Caucasian Fe here for annual exam.  [facility] and now on Phentermine since 02/28/13 and has since lost 10 lb. Trying to do better with food choices. Exercise with elliptical and walking.  Has plantar fascitis and has seen a podiatrist.  Treated with cortisone injections, brace, NSAID'S. She has had a few episodes of being warmer than usual but not really hot flashes  Patient's last menstrual period was 03/06/2012.   TLH/ Bilateral Salpingectomy/ cysto 02/2102      Secondary to UTE fibroids. Sexually active: yes  The current method of family planning is status post hysterectomy.    Exercising: yes  Home exercise routine includes eliptical at home 5x week. Smoker:  no  Health Maintenance: Pap:  09/24/11 MMG:  06/11/10 Asked if it can be scheduled for her today. Colonoscopy:  N/a - pt.declines at this time BMD:   n/a TDaP:  4/05 Labs: Hgb 14.8    reports that she quit smoking about 24 years ago. Her smoking use included Cigarettes. She has a 5 pack-year smoking history. She has never used smokeless tobacco. She reports that  drinks alcohol. She reports that she does not use illicit drugs.  Past Medical History  Diagnosis Date  . Encounter for blood transfusion 1996    with childbirth-[place]  . Anxiety   . SVD (spontaneous vaginal delivery)     x 2  . Dyspareunia   . Fibroid     [facility]olded [facility] 03/06/12    Past Surgical History  Procedure Laterality Date  . Knee arthroscopy  as teen    left  . Fibroma on tongue  2012  . Wisdom tooth extraction    . Cystoscopy  03/06/2012    Procedure: CYSTOSCOPY;  Surgeon: Annamaria Boots, MD;  Location: [facility];  Service: Gynecology;  Laterality: N/A;  . Vaginal delivery      x2  . Abdominal hysterectomy  02/2012    TLH    Current Outpatient Prescriptions  Medication Sig Dispense Refill  . phentermine 37.5 MG capsule Take 37.5 mg by mouth every morning.       No current  facility-administered medications for this visit.    Family History  Problem Relation Age of Onset  . Cancer Mother     melanoma  . Osteoarthritis Mother   . Diabetes Maternal Grandmother   . Thyroid disease Maternal Grandmother   . Cancer Maternal Grandfather     pancreatic cancer    ROS:  Pertinent items are noted in HPI.  Otherwise, a comprehensive ROS was negative.  Exam:   BP 120/76  Pulse 68  Resp 18  Ht 5\' 5"  (1.651 m)  Wt 188 lb (85.276 kg)  BMI 31.28 kg/m2  LMP 03/06/2012 Height: 5\' 5"  (165.1 cm)  Ht Readings from Last 3 Encounters:  03/19/13 5\' 5"  (1.651 m)  03/06/12 5\' 5"  (1.651 m)  03/06/12 5\' 5"  (1.651 m)    General appearance: alert, cooperative and appears stated age Head: Normocephalic, without obvious abnormality, atraumatic Neck: no adenopathy, supple, symmetrical, trachea midline and thyroid normal to inspection and palpation Lungs: clear to auscultation bilaterally Breasts: normal appearance, no masses or tenderness Heart: regular rate and rhythm Abdomen: soft, non-tender; no masses,  no organomegaly Extremities: extremities normal, atraumatic, no cyanosis or edema Skin: Skin color, texture, turgor normal. No rashes or lesions Lymph nodes: Cervical, supraclavicular, and axillary nodes normal. No abnormal inguinal nodes palpated Neurologic: Grossly normal   Pelvic:  External genitalia:  no lesions              Urethra:  normal appearing urethra with no masses, tenderness or lesions              Bartholin's and Skene's: normal                 Vagina: normal appearing vagina with normal color and discharge, no lesions              Cervix: absent              Pap taken: no Bimanual Exam:  Uterus:  uterus absent              Adnexa: no mass, fullness, tenderness               Rectovaginal: Confirms               Anus:  normal sphincter tone, no lesions  A:  Well Woman with normal exam  S/P TLH / Bilateral salpingectomy/ Cysto 02/2012 for  fibroids  Situational anxiety  P:   Pap smear as per guidelines   She may call back if she decides to restart  Wellbutrin XL 300 mg.She has been  using only prn for situational anxiety.  But she would like to check and make sure  Wellness  MD is OK with her taking with her diet pill.    Mammogram was scheduled for today at 1:00 pm  counseled on breast self exam, adequate intake of calcium and vitamin D,   diet and exercise, Kegel's exercises  additional lab tests per Epic Care orders - future order placed for    FLP, CMP, TSH, Vit D return annually or prn  An After Visit Summary was printed and given to the patient.

## 2013-03-19 NOTE — Patient Instructions (Addendum)

## 2013-03-20 ENCOUNTER — Other Ambulatory Visit: Payer: Self-pay | Admitting: Nurse Practitioner

## 2013-03-20 DIAGNOSIS — R928 Other abnormal and inconclusive findings on diagnostic imaging of breast: Secondary | ICD-10-CM

## 2013-03-20 LAB — HEMOGLOBIN, FINGERSTICK: Hemoglobin, fingerstick: 14.8 g/dL (ref 12.0–16.0)

## 2013-03-21 NOTE — Progress Notes (Signed)
Encounter reviewed by dr. Conley Simmonds.

## 2013-04-02 ENCOUNTER — Ambulatory Visit
Admission: RE | Admit: 2013-04-02 | Discharge: 2013-04-02 | Disposition: A | Discharge status: Home / Self Care | Payer: Managed Care, Other (non HMO) | Source: Ambulatory Visit | Attending: Nurse Practitioner | Admitting: Nurse Practitioner

## 2013-04-02 DIAGNOSIS — R928 Other abnormal and inconclusive findings on diagnostic imaging of breast: Secondary | ICD-10-CM

## 2013-05-28 HISTORY — PX: PLANTAR FASCIA RELEASE: SHX2239

## 2013-10-11 ENCOUNTER — Other Ambulatory Visit: Payer: Self-pay | Admitting: Nurse Practitioner

## 2013-10-11 DIAGNOSIS — R921 Mammographic calcification found on diagnostic imaging of breast: Secondary | ICD-10-CM

## 2013-10-12 ENCOUNTER — Other Ambulatory Visit: Payer: Self-pay | Admitting: Internal Medicine

## 2013-10-12 DIAGNOSIS — R921 Mammographic calcification found on diagnostic imaging of breast: Secondary | ICD-10-CM

## 2013-10-18 ENCOUNTER — Ambulatory Visit
Admission: RE | Admit: 2013-10-18 | Discharge: 2013-10-18 | Disposition: A | Discharge status: Home / Self Care | Payer: Managed Care, Other (non HMO) | Source: Ambulatory Visit | Attending: Nurse Practitioner | Admitting: Nurse Practitioner

## 2013-10-18 DIAGNOSIS — R921 Mammographic calcification found on diagnostic imaging of breast: Secondary | ICD-10-CM

## 2014-01-01 ENCOUNTER — Encounter: Payer: Self-pay | Admitting: Nurse Practitioner

## 2014-03-21 ENCOUNTER — Ambulatory Visit: Payer: Managed Care, Other (non HMO) | Admitting: Nurse Practitioner

## 2014-04-01 ENCOUNTER — Encounter: Payer: Self-pay | Admitting: Nurse Practitioner

## 2014-04-01 ENCOUNTER — Ambulatory Visit (INDEPENDENT_AMBULATORY_CARE_PROVIDER_SITE_OTHER): Payer: Managed Care, Other (non HMO) | Admitting: Nurse Practitioner

## 2014-04-01 VITALS — BP 108/76 | HR 72 | Ht 65.5 in | Wt 166.0 lb

## 2014-04-01 DIAGNOSIS — Z23 Encounter for immunization: Secondary | ICD-10-CM

## 2014-04-01 DIAGNOSIS — Z1211 Encounter for screening for malignant neoplasm of colon: Secondary | ICD-10-CM

## 2014-04-01 DIAGNOSIS — Z0000 Encounter for general adult medical examination without abnormal findings: Secondary | ICD-10-CM

## 2014-04-01 DIAGNOSIS — Z01419 Encounter for gynecological examination (general) (routine) without abnormal findings: Secondary | ICD-10-CM

## 2014-04-01 LAB — POCT URINALYSIS DIPSTICK
Bilirubin, UA: NEGATIVE
Glucose, UA: NEGATIVE
Ketones, UA: NEGATIVE
Leukocytes, UA: NEGATIVE
NITRITE UA: NEGATIVE
PH UA: 7
PROTEIN UA: NEGATIVE
RBC UA: NEGATIVE
UROBILINOGEN UA: NEGATIVE

## 2014-04-01 MED ORDER — TETANUS-DIPHTH-ACELL PERTUSSIS 5-2.5-18.5 LF-MCG/0.5 IM SUSP
0.5000 mL | Freq: Once | INTRAMUSCULAR | Status: AC
  Administered 2014-04-01: 0.5 mL via INTRAMUSCULAR

## 2014-04-01 NOTE — Patient Instructions (Signed)

## 2014-04-01 NOTE — Progress Notes (Signed)
53 y.o. [id number] Married Caucasian Fe here for annual exam. No vaso symptoms. No vaginal dryness. Youngest son just graduated from [employer] and will be going off to college. She feels well without new health problems.  Ok to schedule colonoscopy in the fall.  Patient's last menstrual period was 03/06/2012.          Sexually active: Yes.    The current method of family planning is none.    Exercising: Yes.    Home exercise routine includes walking 30 mins a day and yoga. Smoker:  no  Health Maintenance: Pap:  09/24/11, Negative MMG:  10/24/13, probably benign calcifications in the left breast, due back in 6 months Colonoscopy:  No TDaP:  12/2003 Labs: will return for fasting   Urine: Normal   reports that she quit smoking about 25 years ago. Her smoking use included Cigarettes. She has a 5 pack-year smoking history. She has never used smokeless tobacco. She reports that she drinks about 3 ounces of alcohol per week. She reports that she does not use illicit drugs.  Past Medical History  Diagnosis Date  . Encounter for blood transfusion 1996    with childbirth-[place]  . Anxiety   . SVD (spontaneous vaginal delivery)     x 2  . Dyspareunia   . Fibroid     Resolded by [facility] 03/06/12    Past Surgical History  Procedure Laterality Date  . Knee arthroscopy  as teen    left  . Fibroma on tongue  2012  . Wisdom tooth extraction    . Cystoscopy  03/06/2012    Procedure: CYSTOSCOPY;  Surgeon: Lyman Speller, MD;  Location: [facility];  Service: Gynecology;  Laterality: N/A;  . Vaginal delivery      x2  . Abdominal hysterectomy  02/2012    TLH  . Plantar fascia release Right 05/2013    Current Outpatient Prescriptions  Medication Sig Dispense Refill  . etodolac (LODINE) 200 MG capsule Take 200 mg by mouth 2 (two) times daily. As needed       No current facility-administered medications for this visit.    Family History  Problem Relation Age of Onset  . Cancer Mother      melanoma  . Osteoarthritis Mother   . Diabetes Maternal Grandmother   . Thyroid disease Maternal Grandmother   . Cancer Maternal Grandfather     pancreatic cancer    ROS:  Pertinent items are noted in HPI.  Otherwise, a comprehensive ROS was negative.  Exam:   BP 108/76  Pulse 72  Ht 5' 5.5" (1.664 m)  Wt 166 lb (75.297 kg)  BMI 27.19 kg/m2  LMP 03/06/2012 Height: 5' 5.5" (166.4 cm)  Ht Readings from Last 3 Encounters:  04/01/14 5' 5.5" (1.664 m)  03/19/13 5\' 5"  (1.651 m)  03/06/12 5\' 5"  (1.651 m)    General appearance: alert, cooperative and appears stated age Head: Normocephalic, without obvious abnormality, atraumatic Neck: no adenopathy, supple, symmetrical, trachea midline and thyroid normal to inspection and palpation Lungs: clear to auscultation bilaterally Breasts: normal appearance, no masses or tenderness Heart: regular rate and rhythm Abdomen: soft, non-tender; no masses,  no organomegaly Extremities: extremities normal, atraumatic, no cyanosis or edema Skin: Skin color, texture, turgor normal. No rashes or lesions Lymph nodes: Cervical, supraclavicular, and axillary nodes normal. No abnormal inguinal nodes palpated Neurologic: Grossly normal   Pelvic: External genitalia:  no lesions  Urethra:  normal appearing urethra with no masses, tenderness or lesions              Bartholin's and Skene's: normal                 Vagina: normal appearing vagina with normal color and discharge, no lesions              Cervix: absent              Pap taken: No. Bimanual Exam:  Uterus:  uterus absent              Adnexa: no mass, fullness, tenderness and positive for: tenderness right adnexa since surgery that occurs with deep palpation and if goes over a "bump".  Not present with SA.               Rectovaginal: Confirms               Anus:  normal sphincter tone, no lesions  A:  Well Woman with normal exam  Postmenopausal   S/P TLH/ B Salpingectomy/ cysto  02/2012 tenderness that persist on the right - mild/moderate  Update immunization  P:   Reviewed health and wellness pertinent to exam  Pap smear not taken today  Referral to Dr. Collene Mares for screening colonoscopy  TDaP given today  Mammogram is due 09/2014  Counseled on breast self exam, mammography screening, adequate intake of calcium and vitamin D, diet and exercise, Kegel's exercises return annually or prn  An After Visit Summary was printed and given to the patient.

## 2014-04-05 NOTE — Progress Notes (Signed)
I'd like her to return for PUS of ovaries, if she would.  Thanks.  Reviewed personally.  Felipa Emory, MD.

## 2014-04-08 ENCOUNTER — Telehealth: Payer: Self-pay | Admitting: Nurse Practitioner

## 2014-04-08 DIAGNOSIS — N9489 Other specified conditions associated with female genital organs and menstrual cycle: Secondary | ICD-10-CM

## 2014-04-08 NOTE — Telephone Encounter (Signed)
Discussed with patient the recommendation to have a repeat PUS to evaluate the pain in pelvic and to look at the ovaries..  She is OK to schedule but wants to do this next month which is OK.  will place orders.

## 2014-04-09 ENCOUNTER — Telehealth: Payer: Self-pay | Admitting: Nurse Practitioner

## 2014-04-09 NOTE — Telephone Encounter (Signed)
Left message for patient to call back. Need to inform of appointment with Dr Collene Mares 08.04 @ 0900.

## 2014-07-29 ENCOUNTER — Encounter: Payer: Self-pay | Admitting: Nurse Practitioner

## 2014-09-27 DIAGNOSIS — M1990 Unspecified osteoarthritis, unspecified site: Secondary | ICD-10-CM

## 2014-09-27 HISTORY — DX: Unspecified osteoarthritis, unspecified site: M19.90

## 2015-01-29 ENCOUNTER — Telehealth: Payer: Self-pay | Admitting: *Deleted

## 2015-01-29 NOTE — Telephone Encounter (Signed)
Recall Notes:  6 month MMG recall  Last MMG:  10/18/13 IMPRESSION: Stable probably benign calcifications in the left breast.  RECOMMENDATION: Bilateral diagnostic mammogram in June 2015 is recommended to complete a full year followup.  Pt overdue for mammogram recall.  Due 03/2014.  Follow up appointment has not been made.  Please call patient to schedule.  [facility]

## 2015-01-30 NOTE — Telephone Encounter (Signed)
Pt returning call

## 2015-01-30 NOTE — Telephone Encounter (Signed)
Left Voicemail for pt to call back.

## 2015-01-30 NOTE — Telephone Encounter (Signed)
Left Voicemail to call back re: MMG

## 2015-02-05 ENCOUNTER — Other Ambulatory Visit: Payer: Self-pay | Admitting: Nurse Practitioner

## 2015-02-05 DIAGNOSIS — R921 Mammographic calcification found on diagnostic imaging of breast: Secondary | ICD-10-CM

## 2015-02-05 NOTE — Telephone Encounter (Signed)
Pt scheduled MMG.  Recall entered for 02/24/15.   Routing to provider for final review.  Closing encounter.

## 2015-02-05 NOTE — Telephone Encounter (Addendum)
-   Called patient agreed for me to schedule appt at [facility] [address] spoke with Baker Janus. Scheduled appt for Diagnostic Bilateral MMG w/ [place] if needed Monday 02/17/15 Arrive at 3:40pm for 4:00pm appt.   - Called patient back and was informed of date and time of appt. She agreed and was appreciative.   - Ms Chong Sicilian please sign off order in Epic.

## 2015-02-13 ENCOUNTER — Other Ambulatory Visit: Payer: Managed Care, Other (non HMO)

## 2015-02-14 ENCOUNTER — Other Ambulatory Visit: Payer: Self-pay

## 2015-02-14 ENCOUNTER — Other Ambulatory Visit: Payer: Self-pay | Admitting: Nurse Practitioner

## 2015-02-14 DIAGNOSIS — R921 Mammographic calcification found on diagnostic imaging of breast: Secondary | ICD-10-CM

## 2015-02-17 ENCOUNTER — Ambulatory Visit
Admission: RE | Admit: 2015-02-17 | Discharge: 2015-02-17 | Disposition: A | Discharge status: Home / Self Care | Payer: 59 | Source: Ambulatory Visit | Attending: Nurse Practitioner | Admitting: Nurse Practitioner

## 2015-02-17 DIAGNOSIS — R921 Mammographic calcification found on diagnostic imaging of breast: Secondary | ICD-10-CM

## 2015-03-28 DIAGNOSIS — IMO0002 Reserved for concepts with insufficient information to code with codable children: Secondary | ICD-10-CM

## 2015-03-28 HISTORY — DX: Reserved for concepts with insufficient information to code with codable children: IMO0002

## 2015-04-08 ENCOUNTER — Ambulatory Visit (INDEPENDENT_AMBULATORY_CARE_PROVIDER_SITE_OTHER): Payer: 59 | Admitting: Nurse Practitioner

## 2015-04-08 ENCOUNTER — Other Ambulatory Visit: Payer: Self-pay | Admitting: Nurse Practitioner

## 2015-04-08 ENCOUNTER — Encounter: Payer: Self-pay | Admitting: Nurse Practitioner

## 2015-04-08 VITALS — BP 100/66 | HR 64 | Ht 65.25 in | Wt 176.0 lb

## 2015-04-08 DIAGNOSIS — E559 Vitamin D deficiency, unspecified: Secondary | ICD-10-CM

## 2015-04-08 DIAGNOSIS — Z0000 Encounter for general adult medical examination without abnormal findings: Secondary | ICD-10-CM

## 2015-04-08 DIAGNOSIS — R87629 Unspecified abnormal cytological findings in specimens from vagina: Secondary | ICD-10-CM

## 2015-04-08 DIAGNOSIS — Z01419 Encounter for gynecological examination (general) (routine) without abnormal findings: Secondary | ICD-10-CM | POA: Diagnosis not present

## 2015-04-08 DIAGNOSIS — Z113 Encounter for screening for infections with a predominantly sexual mode of transmission: Secondary | ICD-10-CM | POA: Diagnosis not present

## 2015-04-08 DIAGNOSIS — R899 Unspecified abnormal finding in specimens from other organs, systems and tissues: Secondary | ICD-10-CM

## 2015-04-08 DIAGNOSIS — R896 Abnormal cytological findings in specimens from other organs, systems and tissues: Secondary | ICD-10-CM | POA: Diagnosis not present

## 2015-04-08 LAB — POCT URINALYSIS DIPSTICK
Bilirubin, UA: NEGATIVE
Blood, UA: NEGATIVE
Glucose, UA: NEGATIVE
Ketones, UA: NEGATIVE
Leukocytes, UA: NEGATIVE
NITRITE UA: NEGATIVE
PH UA: 5
Protein, UA: NEGATIVE
Urobilinogen, UA: NEGATIVE

## 2015-04-08 LAB — COMPREHENSIVE METABOLIC PANEL
ALBUMIN: 4.1 g/dL (ref 3.5–5.2)
ALT: 12 U/L (ref 0–35)
AST: 14 U/L (ref 0–37)
Alkaline Phosphatase: 56 U/L (ref 39–117)
BUN: 14 mg/dL (ref 6–23)
CALCIUM: 8.9 mg/dL (ref 8.4–10.5)
CHLORIDE: 106 meq/L (ref 96–112)
CO2: 25 mEq/L (ref 19–32)
CREATININE: 0.75 mg/dL (ref 0.50–1.10)
GLUCOSE: 103 mg/dL — AB (ref 70–99)
POTASSIUM: 4.4 meq/L (ref 3.5–5.3)
Sodium: 141 mEq/L (ref 135–145)
Total Bilirubin: 0.5 mg/dL (ref 0.2–1.2)
Total Protein: 6.3 g/dL (ref 6.0–8.3)

## 2015-04-08 LAB — LIPID PANEL
CHOLESTEROL: 247 mg/dL — AB (ref 0–200)
HDL: 77 mg/dL (ref >=46)
LDL Cholesterol: 155 mg/dL — ABNORMAL HIGH (ref 0–99)
Total CHOL/HDL Ratio: 3.2 Ratio
Triglycerides: 73 mg/dL (ref <150)
VLDL: 15 mg/dL (ref 0–40)

## 2015-04-08 LAB — TSH: TSH: 1.854 u[IU]/mL (ref 0.350–4.500)

## 2015-04-08 NOTE — Progress Notes (Signed)
Patient ID: Meagan Harrington, female   DOB: 04-18-1961, 54 y.o.   MRN: [id number] 54 y.o. [id number] legally separated  Caucasian Fe here for annual exam.  Recently had rheumatology evaluation for multiple joint pain and all test for connective tissue disorder was negative.  Strong family history with her mother for this at a young age.  Now back to Orthopedist and is doing NSAID'S and PT.  No vaso symptoms or vaginal dryness.  Wants to see if menopausal   She is now separated from her husband due to his ETOH issues.  She is struggling financially but also the husband is turning the youngest son against her.  She is now dating another partner age 65 and together since December.  Denies any vaginal symptoms.  Patient's last menstrual period was 03/06/2012 (exact date).          Sexually active: Yes.    The current method of family planning is status post hysterectomy.    Exercising: Yes.    walking Smoker:  no  Health Maintenance: Pap:  09/24/11, Negative  MMG:  02/17/15, Bi-Rads 2:  Benign Findings Colonoscopy:  N/a - pt.declines at this time BMD:   n/a TDaP:  04/01/14 Labs:  HB:  12.4  Urine:  Negative    reports that she quit smoking about 26 years ago. Her smoking use included Cigarettes. She has a 5 pack-year smoking history. She has never used smokeless tobacco. She reports that she drinks about 3.6 oz of alcohol per week. She reports that she does not use illicit drugs.  Past Medical History  Diagnosis Date  . Encounter for blood transfusion 1996    with childbirth-[place]  . Anxiety   . SVD (spontaneous vaginal delivery)     x 2  . Dyspareunia   . Fibroid     Resolded by [facility] 03/06/12  . Arthritis 2016    Past Surgical History  Procedure Laterality Date  . Knee arthroscopy  as teen    left  . Fibroma on tongue  2012  . Wisdom tooth extraction    . Cystoscopy  03/06/2012    Procedure: CYSTOSCOPY;  Surgeon: Lyman Speller, MD;  Location: [facility];  Service: Gynecology;   Laterality: N/A;  . Vaginal delivery      x2  . Abdominal hysterectomy  02/2012    TLH  . Plantar fascia release Right 05/2013    Current Outpatient Prescriptions  Medication Sig Dispense Refill  . etodolac (LODINE) 500 MG tablet Take 1 tablet by mouth 2 (two) times daily.     No current facility-administered medications for this visit.    Family History  Problem Relation Age of Onset  . Cancer Mother     melanoma  . Osteoarthritis Mother   . Scleroderma Mother   . Raynaud syndrome Mother   . Diabetes Maternal Grandmother   . Thyroid disease Maternal Grandmother   . Cancer Maternal Grandfather     pancreatic cancer    ROS:  Pertinent items are noted in HPI.  Otherwise, a comprehensive ROS was negative.  Exam:   BP 100/66 mmHg  Pulse 64  Ht 5' 5.25" (1.657 m)  Wt 176 lb (79.833 kg)  BMI 29.08 kg/m2  LMP 03/06/2012 (Exact Date) Height: 5' 5.25" (165.7 cm) Ht Readings from Last 3 Encounters:  04/08/15 5' 5.25" (1.657 m)  04/01/14 5' 5.5" (1.664 m)  03/19/13 5\' 5"  (1.651 m)    General appearance: alert, cooperative and appears stated age Head: Normocephalic,  without obvious abnormality, atraumatic Neck: no adenopathy, supple, symmetrical, trachea midline and thyroid normal to inspection and palpation Lungs: clear to auscultation bilaterally Breasts: normal appearance, no masses or tenderness Heart: regular rate and rhythm Abdomen: soft, non-tender; no masses,  no organomegaly Extremities: extremities normal, atraumatic, no cyanosis or edema Skin: Skin color, texture, turgor normal. No rashes or lesions Lymph nodes: Cervical, supraclavicular, and axillary nodes normal. No abnormal inguinal nodes palpated Neurologic: Grossly normal   Pelvic: External genitalia:  no lesions              Urethra:  normal appearing urethra with no masses, tenderness or lesions              Bartholin's and Skene's: normal                 Vagina: normal appearing vagina with normal  color and discharge, no lesions              Cervix: absent              Pap taken: Yes.   Bimanual Exam:  Uterus:  uterus absent              Adnexa: no mass, fullness, tenderness               Rectovaginal: Confirms               Anus:  normal sphincter tone, no lesions  Chaperone present:  yes  A:  Well Woman with normal exam  Postmenopausal - most likely S/P TLH/ B Salpingectomy/ cysto 02/2012 tenderness that persist on the right - better now R/O STD's  P:   Reviewed health and wellness pertinent to exam  Pap smear as above  Mammogram is due 01/2016  Counseled on breast self exam, mammography screening, adequate intake of calcium and vitamin D, diet and exercise return annually or prn  An After Visit Summary was printed and given to the patient.

## 2015-04-08 NOTE — Patient Instructions (Signed)

## 2015-04-09 LAB — HEMOGLOBIN A1C
HEMOGLOBIN A1C: 5.6 % (ref <5.7)
MEAN PLASMA GLUCOSE: 114 mg/dL (ref <117)

## 2015-04-09 LAB — FOLLICLE STIMULATING HORMONE: FSH: 48.7 m[IU]/mL

## 2015-04-09 LAB — STD PANEL
HIV 1&2 Ab, 4th Generation: NONREACTIVE
Hepatitis B Surface Ag: NEGATIVE

## 2015-04-09 LAB — VITAMIN D 25 HYDROXY (VIT D DEFICIENCY, FRACTURES): Vit D, 25-Hydroxy: 24 ng/mL — ABNORMAL LOW (ref 30–100)

## 2015-04-10 LAB — IPS N GONORRHOEA AND CHLAMYDIA BY PCR

## 2015-04-10 LAB — HEMOGLOBIN, FINGERSTICK: Hemoglobin, fingerstick: 12.4 g/dL (ref 12.0–16.0)

## 2015-04-11 LAB — IPS PAP TEST WITH HPV

## 2015-04-11 NOTE — Progress Notes (Signed)
Encounter reviewed by Dr. Zane Samson Amundson C. Silva.  

## 2015-04-14 ENCOUNTER — Telehealth: Payer: Self-pay | Admitting: *Deleted

## 2015-04-14 NOTE — Addendum Note (Signed)
Addended by: Antonietta Barcelona on: 04/14/2015 08:02 AM   Modules accepted: Orders

## 2015-04-14 NOTE — Telephone Encounter (Signed)
I have attempted to contact this patient by phone with the following results: left message to return call to [place] at [phone] on answering machine (mobile per [facility]).  No personal information given.  [phone] (Mobile) *Preferred*

## 2015-04-14 NOTE — Telephone Encounter (Signed)
-----   Message from Kem Boroughs, [place] sent at 04/11/2015  8:06 AM EDT ----- Please let pt. Know that STD paenel with HIV, Hep B, STS, GC, Chl was all negative.  The Vit D was low - follow protocol. FSH was some elevated but in the early menopausal range.  The Lipid apanel shows elevated total cholesterol at 247 and elevated LDL @ 155.  Needs to be on a low cholesterol diet for 3 months then recheck at same time of Vit D. If she needs a nutritionist for help we can make a referral. The CMP was normal except slight elevated glucose - a HGB AIC was added and the results is 5.6 - normal range.  The TSH also is normal.

## 2015-04-14 NOTE — Telephone Encounter (Signed)
Return call to Stephanie. °

## 2015-04-14 NOTE — Telephone Encounter (Signed)
I have attempted to contact this patient by phone with the following results: left message to return call to [place] at [phone] on answering machine (mobile per [facility]). No personal information given. [phone] (Mobile) *Preferred*

## 2015-04-16 LAB — IPS HPV GENOTYPING 16/18

## 2015-04-17 NOTE — Telephone Encounter (Signed)
I have attempted to contact this patient by phone with the following results: left message to return call to [place] at [phone] on answering machine (mobile per [facility]). No personal information given. [phone] (Mobile) *Preferred*

## 2015-04-22 MED ORDER — VITAMIN D (ERGOCALCIFEROL) 1.25 MG (50000 UNIT) PO CAPS: 50000.0000 [IU] | ORAL_CAPSULE | ORAL | Status: DC

## 2015-04-22 NOTE — Addendum Note (Signed)
Addended by: Abelino Derrick C on: 04/22/2015 10:00 AM   Modules accepted: Orders, SmartSet

## 2015-04-22 NOTE — Addendum Note (Signed)
Addended by: Abelino Derrick C on: 04/22/2015 10:01 AM   Modules accepted: SmartSet

## 2015-04-23 NOTE — Telephone Encounter (Signed)
Pt notified in result note.  Closing encounter. 

## 2015-04-28 ENCOUNTER — Telehealth: Payer: Self-pay

## 2015-04-28 NOTE — Telephone Encounter (Signed)
Spoke with patient. Advised of results as seen below from Milford Cage, [place]. Patient is agreeable and verbalizes understanding. Aex is scheduled for 04/26/2016 with Milford Cage, FNP. 08 recall entered.  Routing to provider for final review. Patient agreeable to disposition. Will close encounter.

## 2015-04-28 NOTE — Telephone Encounter (Signed)
-----   Message from Kem Boroughs, [place] sent at 04/22/2015  5:17 PM EDT ----- Please let pt. Know that pap was normal but the HPV test was abnormal - we did genotype testing for # 16, 18, 45 and this was negative.  She should have a repeat pap with HPV done again in 1 year.  08 recall

## 2015-07-11 ENCOUNTER — Other Ambulatory Visit: Payer: Self-pay

## 2015-07-22 ENCOUNTER — Other Ambulatory Visit: Payer: Self-pay

## 2015-07-28 ENCOUNTER — Other Ambulatory Visit (INDEPENDENT_AMBULATORY_CARE_PROVIDER_SITE_OTHER): Payer: Commercial Managed Care - HMO

## 2015-07-28 DIAGNOSIS — E559 Vitamin D deficiency, unspecified: Secondary | ICD-10-CM

## 2015-07-28 DIAGNOSIS — R899 Unspecified abnormal finding in specimens from other organs, systems and tissues: Secondary | ICD-10-CM

## 2015-07-28 LAB — LIPID PANEL
CHOL/HDL RATIO: 3.1 ratio (ref <=5.0)
Cholesterol: 206 mg/dL — ABNORMAL HIGH (ref 125–200)
HDL: 67 mg/dL (ref >=46)
LDL Cholesterol: 124 mg/dL (ref <130)
TRIGLYCERIDES: 75 mg/dL (ref <150)
VLDL: 15 mg/dL (ref <30)

## 2015-07-29 LAB — VITAMIN D 25 HYDROXY (VIT D DEFICIENCY, FRACTURES): Vit D, 25-Hydroxy: 36 ng/mL (ref 30–100)

## 2015-07-29 MED ORDER — VITAMIN D (ERGOCALCIFEROL) 1.25 MG (50000 UNIT) PO CAPS: 50000.0000 [IU] | ORAL_CAPSULE | ORAL | Status: DC

## 2015-07-29 NOTE — Addendum Note (Signed)
Addended by: Graylon Good on: 07/29/2015 03:57 PM   Modules accepted: Orders

## 2016-02-04 DIAGNOSIS — J209 Acute bronchitis, unspecified: Secondary | ICD-10-CM | POA: Diagnosis not present

## 2016-02-04 DIAGNOSIS — J302 Other seasonal allergic rhinitis: Secondary | ICD-10-CM | POA: Diagnosis not present

## 2016-04-26 ENCOUNTER — Ambulatory Visit (INDEPENDENT_AMBULATORY_CARE_PROVIDER_SITE_OTHER): Payer: BLUE CROSS/BLUE SHIELD | Admitting: Nurse Practitioner

## 2016-04-26 ENCOUNTER — Encounter: Payer: Self-pay | Admitting: Nurse Practitioner

## 2016-04-26 VITALS — BP 126/92 | HR 60 | Ht 65.0 in | Wt 198.0 lb

## 2016-04-26 DIAGNOSIS — Z780 Asymptomatic menopausal state: Secondary | ICD-10-CM

## 2016-04-26 DIAGNOSIS — F439 Reaction to severe stress, unspecified: Secondary | ICD-10-CM | POA: Insufficient documentation

## 2016-04-26 DIAGNOSIS — R030 Elevated blood-pressure reading, without diagnosis of hypertension: Secondary | ICD-10-CM

## 2016-04-26 DIAGNOSIS — Z1272 Encounter for screening for malignant neoplasm of vagina: Secondary | ICD-10-CM | POA: Diagnosis not present

## 2016-04-26 DIAGNOSIS — Z01419 Encounter for gynecological examination (general) (routine) without abnormal findings: Secondary | ICD-10-CM | POA: Diagnosis not present

## 2016-04-26 DIAGNOSIS — Z658 Other specified problems related to psychosocial circumstances: Secondary | ICD-10-CM

## 2016-04-26 DIAGNOSIS — IMO0001 Reserved for inherently not codable concepts without codable children: Secondary | ICD-10-CM

## 2016-04-26 DIAGNOSIS — Z0000 Encounter for general adult medical examination without abnormal findings: Secondary | ICD-10-CM | POA: Diagnosis not present

## 2016-04-26 DIAGNOSIS — R87620 Atypical squamous cells of undetermined significance on cytologic smear of vagina (ASC-US): Secondary | ICD-10-CM | POA: Diagnosis not present

## 2016-04-26 LAB — CBC WITH DIFFERENTIAL/PLATELET
BASOS ABS: 0 {cells}/uL (ref 0–200)
Basophils Relative: 0 %
EOS PCT: 4 %
Eosinophils Absolute: 204 cells/uL (ref 15–500)
HCT: 40.6 % (ref 35.0–45.0)
HEMOGLOBIN: 13.5 g/dL (ref 11.7–15.5)
LYMPHS PCT: 34 %
Lymphs Abs: 1734 cells/uL (ref 850–3900)
MCH: 30.6 pg (ref 27.0–33.0)
MCHC: 33.3 g/dL (ref 32.0–36.0)
MCV: 92.1 fL (ref 80.0–100.0)
MPV: 9.7 fL (ref 7.5–12.5)
Monocytes Absolute: 255 cells/uL (ref 200–950)
Monocytes Relative: 5 %
NEUTROS PCT: 57 %
Neutro Abs: 2907 cells/uL (ref 1500–7800)
Platelets: 238 10*3/uL (ref 140–400)
RBC: 4.41 MIL/uL (ref 3.80–5.10)
RDW: 13.9 % (ref 11.0–15.0)
WBC: 5.1 10*3/uL (ref 3.8–10.8)

## 2016-04-26 LAB — TSH: TSH: 2.25 m[IU]/L

## 2016-04-26 LAB — COMPREHENSIVE METABOLIC PANEL
ALT: 33 U/L — ABNORMAL HIGH (ref 6–29)
AST: 28 U/L (ref 10–35)
Albumin: 4.1 g/dL (ref 3.6–5.1)
Alkaline Phosphatase: 66 U/L (ref 33–130)
BILIRUBIN TOTAL: 0.4 mg/dL (ref 0.2–1.2)
BUN: 14 mg/dL (ref 7–25)
CO2: 24 mmol/L (ref 20–31)
CREATININE: 0.82 mg/dL (ref 0.50–1.05)
Calcium: 9.3 mg/dL (ref 8.6–10.4)
Chloride: 105 mmol/L (ref 98–110)
GLUCOSE: 93 mg/dL (ref 65–99)
Potassium: 4.5 mmol/L (ref 3.5–5.3)
Sodium: 138 mmol/L (ref 135–146)
Total Protein: 6.5 g/dL (ref 6.1–8.1)

## 2016-04-26 LAB — LIPID PANEL
CHOLESTEROL: 225 mg/dL — AB (ref 125–200)
HDL: 76 mg/dL (ref >=46)
LDL Cholesterol: 132 mg/dL — ABNORMAL HIGH (ref <130)
Total CHOL/HDL Ratio: 3 Ratio (ref <=5.0)
Triglycerides: 87 mg/dL (ref <150)
VLDL: 17 mg/dL (ref <30)

## 2016-04-26 LAB — POCT URINALYSIS DIPSTICK
Bilirubin, UA: NEGATIVE
Glucose, UA: NEGATIVE
Ketones, UA: NEGATIVE
Leukocytes, UA: NEGATIVE
Nitrite, UA: NEGATIVE
PROTEIN UA: NEGATIVE
RBC UA: NEGATIVE
UROBILINOGEN UA: NEGATIVE
pH, UA: 5

## 2016-04-26 LAB — HEMOGLOBIN A1C
HEMOGLOBIN A1C: 5.6 % (ref <5.7)
Mean Plasma Glucose: 114 mg/dL

## 2016-04-26 LAB — HEMOGLOBIN, FINGERSTICK: Hemoglobin, fingerstick: 13.6 g/dL (ref 12.0–16.0)

## 2016-04-26 LAB — HEPATITIS C ANTIBODY: HCV Ab: NEGATIVE

## 2016-04-26 MED ORDER — BUPROPION HCL ER (XL) 150 MG PO TB24: 150.0000 mg | ORAL_TABLET | Freq: Every day | ORAL | 0 refills | Status: DC

## 2016-04-26 MED ORDER — ALPRAZOLAM 0.5 MG PO TABS: 0.5000 mg | ORAL_TABLET | Freq: Every evening | ORAL | 0 refills | Status: DC | PRN

## 2016-04-26 MED ORDER — MELOXICAM 15 MG PO TABS: 15.0000 mg | ORAL_TABLET | Freq: Every day | ORAL | 0 refills | Status: DC

## 2016-04-26 MED ORDER — CLOBETASOL PROPIONATE 0.05 % EX GEL: 0.5000 mg | Freq: Two times a day (BID) | CUTANEOUS | 1 refills | Status: DC

## 2016-04-26 NOTE — Progress Notes (Signed)
Reviewed personally.  M. Suzanne Hinton Luellen, MD.  

## 2016-04-26 NOTE — Patient Instructions (Signed)

## 2016-04-26 NOTE — Progress Notes (Signed)
Patient ID: Meagan Harrington, female   DOB: 27-Nov-1960, 55 y.o.   MRN: [id number]  55 y.o. [id number] Divorced  Caucasian Fe here for annual exam.  Stress fracture of 2 toes on left foot and a morton's neuroma that is being followed by podiatrist.  She is also in the process of changing PCP and needs a refill on a few med's until she gets in to see [facility] med.  She has been under some stress with her son moving to [place] to go to [employer]. The youngest son also away in college in [place].  Her ex husband all of a sudden dropped her from his insurance policy.  She is still dating the same partner for over a year.  Patient's last menstrual period was 03/06/2012 (exact date).          Sexually active: Yes.    The current method of family planning is status post hysterectomy.    Exercising: No.  The patient does not participate in regular exercise at present. Smoker:  no  Health Maintenance: Pap: 04/08/15, Negative with pos HR HPV, neg #16/18/45 MMG: 02/17/15, Bilateral Diagnostic, Bi-Rads 2: Benign; screening in one year Colonoscopy: Never - will be checking new insurance as to coverage TDaP:  04/01/14 Hep C: done today HIV: 04/08/15 Labs: HB: 13.6 Also fasting labs today Urine: negative   reports that she quit smoking about 27 years ago. Her smoking use included Cigarettes. She has a 5.00 pack-year smoking history. She has never used smokeless tobacco. She reports that she drinks about 3.6 oz of alcohol per week . She reports that she does not use drugs.  Past Medical History:  Diagnosis Date  . Anxiety   . Arthritis 2016  . Dyspareunia   . Encounter for blood transfusion 1996   with childbirth-[place]  . Fibroid    Resolded by [facility] 03/06/12  . SVD (spontaneous vaginal delivery)    x 2    Past Surgical History:  Procedure Laterality Date  . ABDOMINAL HYSTERECTOMY  02/2012   TLH  . CYSTOSCOPY  03/06/2012   Procedure: CYSTOSCOPY;  Surgeon: Lyman Speller, MD;  Location: [facility];  Service: Gynecology;  Laterality: N/A;  . fibroma on tongue  2012  . KNEE ARTHROSCOPY  as teen   left  . PLANTAR FASCIA RELEASE Right 05/2013  . VAGINAL DELIVERY     x2  . WISDOM TOOTH EXTRACTION      Current Outpatient Prescriptions  Medication Sig Dispense Refill  . etodolac (LODINE) 500 MG tablet Take 1 tablet by mouth 2 (two) times daily.    . Vitamin D, Ergocalciferol, (DRISDOL) 50000 UNITS CAPS capsule Take 1 capsule (50,000 Units total) by mouth every 7 (seven) days. 30 capsule 0   No current facility-administered medications for this visit.     Family History  Problem Relation Age of Onset  . Cancer Mother     melanoma  . Osteoarthritis Mother   . Scleroderma Mother   . Raynaud syndrome Mother   . Diabetes Maternal Grandmother   . Thyroid disease Maternal Grandmother   . Cancer Maternal Grandfather     pancreatic cancer    ROS:  Pertinent items are noted in HPI.  Otherwise, a comprehensive ROS was negative.  Exam:   LMP 03/06/2012 (Exact Date)    Ht Readings from Last 3 Encounters:  04/08/15 5' 5.25" (1.657 m)  04/01/14 5' 5.5" (1.664 m)  03/19/13 5\' 5"  (1.651 m)    General appearance:  alert, cooperative and appears stated age Head: Normocephalic, without obvious abnormality, atraumatic Neck: no adenopathy, supple, symmetrical, trachea midline and thyroid normal to inspection and palpation Lungs: clear to auscultation bilaterally Breasts: normal appearance, no masses or tenderness Heart: regular rate and rhythm Abdomen: soft, non-tender; no masses,  no organomegaly Extremities: extremities normal, atraumatic, no cyanosis or edema Skin: Skin color, texture, turgor normal. No rashes or lesions Lymph nodes: Cervical, supraclavicular, and axillary nodes normal. No abnormal inguinal nodes palpated Neurologic: Grossly normal   Pelvic: External genitalia:  no lesions              Urethra:  normal appearing urethra with no masses, tenderness or lesions               Bartholin's and Skene's: normal                 Vagina: normal appearing vagina with normal color and discharge, no lesions              Cervix: absent              Pap taken: Yes.   Bimanual Exam:  Uterus:  uterus absent              Adnexa: no mass, fullness, tenderness               Rectovaginal: Confirms               Anus:  normal sphincter tone, no lesions  Chaperone present: yes  A:  Well Woman with normal exam  Postmenopausal  S/P TLH/ B Salpingectomy/ cysto 02/2012  History of situational anxiety  History of mouth ulcerations  Elevated BP - isolated most likely due to stress  History of normal pap and + HR HPV 04/08/2015    P:   Reviewed health and wellness pertinent to exam  Pap smear is repeated today with HR HPV  Mammogram is due now and will schedule  She is given a refill on Wellbutrin 150 mg and will be following with new PCP  She is given only # 30 of Xanax 0.5 mg to use 1/2 -1 tablet at hs prn.  No further refills and must be given by PCP for any refills  Refill on Mobic and Vit D  Will follow with labs - fasting  She will have her BP checked at work this week and keep a diary for her PCP  Counseled on breast self exam, mammography screening, menopause, adequate intake of calcium and vitamin D, diet and exercise, Kegel's exercises return annually or prn  An After Visit Summary was printed and given to the patient.

## 2016-04-27 LAB — VITAMIN D 25 HYDROXY (VIT D DEFICIENCY, FRACTURES): Vit D, 25-Hydroxy: 34 ng/mL (ref 30–100)

## 2016-04-29 LAB — IPS PAP TEST WITH HPV

## 2016-04-29 MED ORDER — VITAMIN D (ERGOCALCIFEROL) 1.25 MG (50000 UNIT) PO CAPS: 50000.0000 [IU] | ORAL_CAPSULE | ORAL | 1 refills | Status: DC

## 2016-04-29 NOTE — Addendum Note (Signed)
Addended by: Abelino Derrick C on: 04/29/2016 12:15 PM   Modules accepted: Orders

## 2016-05-03 ENCOUNTER — Other Ambulatory Visit: Payer: Self-pay | Admitting: *Deleted

## 2016-05-03 DIAGNOSIS — R87811 Vaginal high risk human papillomavirus (HPV) DNA test positive: Principal | ICD-10-CM

## 2016-05-03 DIAGNOSIS — R87620 Atypical squamous cells of undetermined significance on cytologic smear of vagina (ASC-US): Secondary | ICD-10-CM

## 2016-05-06 ENCOUNTER — Telehealth: Payer: Self-pay | Admitting: Obstetrics & Gynecology

## 2016-05-06 NOTE — Telephone Encounter (Signed)
Return call to Suzy. °

## 2016-05-06 NOTE — Telephone Encounter (Signed)
Called patient to review benefits for a recommended procedure. Left Voicemail requesting a call back. °

## 2016-05-06 NOTE — Telephone Encounter (Signed)
Spoke with pt regarding benefit for colposcopy. Patient understood and agreeable. Patient scheduled 05/10/16 with Dr Sabra Heck. Pt aware of arrival date and time. Pt aware of 72 hours cancellation policy. No further questions. Ok to close

## 2016-05-10 ENCOUNTER — Encounter: Payer: Self-pay | Admitting: Obstetrics & Gynecology

## 2016-05-10 ENCOUNTER — Ambulatory Visit (INDEPENDENT_AMBULATORY_CARE_PROVIDER_SITE_OTHER): Payer: BLUE CROSS/BLUE SHIELD | Admitting: Obstetrics & Gynecology

## 2016-05-10 VITALS — BP 130/86 | HR 70 | Resp 16 | Ht 65.0 in | Wt 200.8 lb

## 2016-05-10 DIAGNOSIS — R87811 Vaginal high risk human papillomavirus (HPV) DNA test positive: Secondary | ICD-10-CM

## 2016-05-10 DIAGNOSIS — K121 Other forms of stomatitis: Secondary | ICD-10-CM

## 2016-05-10 DIAGNOSIS — R87620 Atypical squamous cells of undetermined significance on cytologic smear of vagina (ASC-US): Secondary | ICD-10-CM

## 2016-05-10 MED ORDER — DESOXIMETASONE 0.05 % EX GEL: 1.0000 "application " | Freq: Two times a day (BID) | CUTANEOUS | 0 refills | Status: DC | PRN

## 2016-05-10 MED ORDER — FOLIC ACID 1 MG PO TABS: 1.0000 mg | ORAL_TABLET | Freq: Every day | ORAL | 4 refills | Status: DC

## 2016-05-11 ENCOUNTER — Telehealth: Payer: Self-pay | Admitting: *Deleted

## 2016-05-11 NOTE — Telephone Encounter (Signed)
Called patient and left a message to call back regarding her PA for Clobetasol gel-eh

## 2016-05-11 NOTE — Telephone Encounter (Signed)
Patient aware that her clobetasol is being denied - Dr. Sabra Heck has already called in an alternative -eh

## 2016-05-12 ENCOUNTER — Telehealth: Payer: Self-pay

## 2016-05-12 ENCOUNTER — Encounter: Payer: Self-pay | Admitting: Nurse Practitioner

## 2016-05-12 NOTE — Telephone Encounter (Signed)
Telephone call routed to [place] for review and advise of possible alternative medications.  Non-Urgent Medical Question  Message [id number]  From SHUNTAVIA ROOPNARINE To Kem Boroughs, FNP Sent 05/12/2016 2:18 PM  Dr Sabra Heck called in a different rx similar to the Temovate gel and it is covered by bcbs but it is still $194.00. Is there anything else we can try? Pharmacy tech recommended Kenalog oral paste. Do you think that may work?   Responsible Party   Pool - [facility] Clinical Pool No one has taken responsibility for this message.  No actions have been taken on this message.

## 2016-05-13 NOTE — Telephone Encounter (Signed)
See phone encounter.

## 2016-05-14 MED ORDER — TRIAMCINOLONE ACETONIDE 0.1 % MT PSTE: 1.0000 "application " | PASTE | Freq: Two times a day (BID) | OROMUCOSAL | 0 refills | Status: DC

## 2016-05-14 NOTE — Telephone Encounter (Signed)
Rx for Triamcinolone .1 % paste 1 application mouth/throat 2 times daily 5 grams 0 RF sent to pharmacy on file.  Detailed message left detailed message at number provided [phone], okay per ROI. Advised alternative medication has been reviewed by Dr.Miller and has been sent to her pharmacy on file. Advised to return call with any further questions.

## 2016-05-14 NOTE — Telephone Encounter (Signed)
Spoke with Suezanne Jacquet at [facility]. Suezanne Jacquet reports alternative for patient is Triamcinolone oral paste which is a .1% 5 gram tube. Which is safe for oral usage.

## 2016-05-14 NOTE — Telephone Encounter (Signed)
Ok to send this to pharmacy.  Thank you.

## 2016-05-14 NOTE — Progress Notes (Signed)
55 y.o. Divorced Caucasian emale here for colposcopy with possible biopsies and/or ECC due to ASCUS with +HR HPV that was obtained. 04/26/16.    Pt had robotic hysterectomy 6/13 with negative cervical pathology.  She'd never had an HPV test as her pap before the hysterectomy was obtained 12/12 and this was just when the guidelines were changing.  Pap 04/08/15 was negative, HR HPV was +, and then 16/18/45 testing was negative.   Repeat Pap and HR HPV was done 04/26/16 as above.    On another topic, pt has recurrent mouth ulcers that she has been prescribed Temovate gel for in the past by a dermatologist.  Rx is now not covered by insurance and she would like another option for treatment.  Reviewed topical steroid products.  Will try desoximetasone 0.05% gel for this.  Prior evaluation/treatment:  none.  Patient's last menstrual period was 03/06/2012 (exact date).          Sexually active: Yes.    The current method of family planning is status post hysterectomy.     Patient has been counseled about results and procedure.  Risks and benefits have bene reviewed including immediate and/or delayed bleeding, infection, cervical scaring from procedure, possibility of needing additional follow up as well as treatment.  rare risks of missing a lesion discussed as well.  All questions answered.  Pt ready to proceed.  BP 130/86 (BP Location: Right Arm, Patient Position: Sitting)   Pulse 70   Resp 16   Ht 5\' 5"  (1.651 m)   Wt 200 lb 12.8 oz (91.1 kg)   LMP 03/06/2012 (Exact Date)   BMI 33.41 kg/m   Physical Exam  Constitutional: She is oriented to person, place, and time. She appears well-developed and well-nourished.  Genitourinary: Vagina normal. There is no rash, tenderness, lesion or injury on the right labia. There is no rash, tenderness, lesion or injury on the left labia.  Lymphadenopathy:       Right: No inguinal adenopathy present.  Neurological: She is alert and oriented to person, place, and  time.  Psychiatric: She has a normal mood and affect.    Speculum placed.  3% acetic acid applied to vagina for >45 seconds.  Vagina visualized with both 7.5X and 15X magnification.  Green filter also used.  No abnormal findings were noted.  Lugols solution was then used to stain entire vagina.  No abnormalities were noted, again.  No biopsies were obtained.  Pt tolerated procedure well and all instruments were removed.   Assessment:  ASCUS pap with +HR HPV (neg 16/18/45 testing 2016) H/O robotic TLH/bilateral salpingectomy 6/13 Recurrent mouth ulcerations that have been evaluated by dermatology in the past  Plan:  Pt needs repeat pap and HR HPV next year. Rx for desoximetasone 0.05% topically BID up to 5 days to pharmacy.  Will need to check cost for her.

## 2016-05-14 NOTE — Telephone Encounter (Signed)
Please contact pharmacy.  I cannot find this paste in my pharmacy book.  I need to know percentage and if ok for oral use.  Thanks.

## 2016-05-21 ENCOUNTER — Other Ambulatory Visit (INDEPENDENT_AMBULATORY_CARE_PROVIDER_SITE_OTHER): Payer: BLUE CROSS/BLUE SHIELD

## 2016-05-21 DIAGNOSIS — R899 Unspecified abnormal finding in specimens from other organs, systems and tissues: Secondary | ICD-10-CM

## 2016-05-21 DIAGNOSIS — Z0000 Encounter for general adult medical examination without abnormal findings: Secondary | ICD-10-CM

## 2016-05-21 LAB — HEPATIC FUNCTION PANEL
ALBUMIN: 3.9 g/dL (ref 3.6–5.1)
ALT: 38 U/L — ABNORMAL HIGH (ref 6–29)
AST: 31 U/L (ref 10–35)
Alkaline Phosphatase: 54 U/L (ref 33–130)
Bilirubin, Direct: 0.1 mg/dL (ref <=0.2)
Indirect Bilirubin: 0.3 mg/dL (ref 0.2–1.2)
Total Bilirubin: 0.4 mg/dL (ref 0.2–1.2)
Total Protein: 6.2 g/dL (ref 6.1–8.1)

## 2016-06-10 ENCOUNTER — Telehealth: Payer: Self-pay | Admitting: *Deleted

## 2016-06-10 NOTE — Telephone Encounter (Signed)
-----   Message from Kem Boroughs, [place] sent at 05/23/2016 11:06 AM EDT ----- Results via my chart:  Please call her to follow up.  Meagan Harrington,  Since the liver test remains elevated at 38 compared to 3 weeks ago at 47 we recommend that you see GI MD for evaluation.  We can help arrange that apt for you. [name] will call you to follow up.

## 2016-06-10 NOTE — Telephone Encounter (Signed)
I have attempted to contact this patient by phone with the following results: left message to return call to [place] at [phone] on answering machine (mobile per [facility]).  Advised call is regarding follow up of lab results.  [phone] (Mobile) *Preferred*

## 2016-06-10 NOTE — Telephone Encounter (Deleted)
-----   Message from Meagan Harrington, [place] sent at 05/23/2016 11:06 AM EDT ----- Results via my chart:  Please call her to follow up.  Meagan Harrington,  Since the liver test remains elevated at 38 compared to 3 weeks ago at 39 we recommend that you see GI MD for evaluation.  We can help arrange that apt for you. Meagan Harrington will call you to follow up.

## 2016-06-14 NOTE — Telephone Encounter (Signed)
I have attempted to contact this patient by phone with the following results: left message to return call to [place] at [phone] on answering machine (mobile per [facility]). Advised call is regarding follow up of lab results. [phone] (Mobile) *Preferred*

## 2016-06-22 ENCOUNTER — Encounter: Payer: Self-pay | Admitting: *Deleted

## 2016-06-28 NOTE — Telephone Encounter (Signed)
Letter mailed on 06/22/16.  Closing encounter until patient calls.

## 2016-08-11 ENCOUNTER — Other Ambulatory Visit: Payer: Self-pay | Admitting: Nurse Practitioner

## 2016-08-11 DIAGNOSIS — Z1231 Encounter for screening mammogram for malignant neoplasm of breast: Secondary | ICD-10-CM

## 2016-08-11 NOTE — Telephone Encounter (Signed)
Return call from patient.    Patient states she has not scheduled with new PCP.  She made a phone call to [facility] today to request a new patient appointment with Dr. Allie Bossier.  She is hoping to hear from that office in the next couple of days.  I also asked patient if she had made an appointment with the GI doctor for evaluation of elevated liver enzymes as requested by Edman Circle, FNP in August.  Patient states she has not scheduled an appointment at this time and would prefer to discuss with primary care when she has that appointment. Discussed results more and advised we refer to Dr. Juanita Craver.  Patient also requests refill for ALPRAZOLAM.  States she called this in to the pharmacy at the same time as the Bupropion.  Last filled - 04/26/16, #30 x 0  Please advise refills and next steps.  Thank you.

## 2016-08-11 NOTE — Telephone Encounter (Signed)
eScribe request from [facility] for refill on BUPROPION 150 MG Last filled - 04/26/16, #90 X 0 refill, until patient seen by new PCP Last AEX - 04/26/16 with Edman Circle, FNP Next AEX - 04/27/17 with Edman Circle, FNP  Message left requesting return call from patient regarding new PCP.

## 2016-08-12 ENCOUNTER — Ambulatory Visit
Admission: RE | Admit: 2016-08-12 | Discharge: 2016-08-12 | Disposition: A | Discharge status: Home / Self Care | Payer: 59 | Source: Ambulatory Visit | Attending: Nurse Practitioner | Admitting: Nurse Practitioner

## 2016-08-12 DIAGNOSIS — Z1231 Encounter for screening mammogram for malignant neoplasm of breast: Secondary | ICD-10-CM

## 2016-08-13 ENCOUNTER — Other Ambulatory Visit: Payer: Self-pay | Admitting: Certified Nurse Midwife

## 2016-08-13 MED ORDER — BUPROPION HCL ER (XL) 150 MG PO TB24: 150.0000 mg | ORAL_TABLET | Freq: Every day | ORAL | 0 refills | Status: DC

## 2016-08-13 NOTE — Telephone Encounter (Signed)
Will refill wellbutrin but not Xanax

## 2016-08-23 ENCOUNTER — Other Ambulatory Visit: Payer: Self-pay

## 2016-08-23 NOTE — Telephone Encounter (Signed)
error 

## 2016-08-27 MED ORDER — ALPRAZOLAM 0.5 MG PO TABS: 0.5000 mg | ORAL_TABLET | Freq: Every evening | ORAL | 0 refills | Status: AC | PRN

## 2016-08-27 NOTE — Telephone Encounter (Signed)
Patient calling to speak with nurse about refill request.

## 2016-08-27 NOTE — Telephone Encounter (Signed)
Medication refill request: Xanax Last AEX:  04/26/16 PG Next AEX: 04/27/17 PG Last MMG (if hormonal medication request): 08/17/16 BIRADS1, [facility] Refill authorized: 04/26/16 #30 0R.    Spoke with patient about this prescription. She states she has a new appt with PCP on 10/11/16. Patient states that she would like to get a refill of Xanax enough to get through until the January appointment. Patient states her son passed away yesterday. He was in a car accident and didn't go to the hospital and passed away later on.   Patient also asked if there is a higher dosage of the Bupropion that she could take?   Please advise. Thank you.

## 2016-08-27 NOTE — Telephone Encounter (Signed)
Left message for patient to return call. -sco

## 2016-08-27 NOTE — Telephone Encounter (Signed)
Patient is returning your call.  

## 2016-10-04 DIAGNOSIS — J101 Influenza due to other identified influenza virus with other respiratory manifestations: Secondary | ICD-10-CM | POA: Diagnosis not present

## 2016-10-11 DIAGNOSIS — F418 Other specified anxiety disorders: Secondary | ICD-10-CM | POA: Diagnosis not present

## 2016-10-11 DIAGNOSIS — E559 Vitamin D deficiency, unspecified: Secondary | ICD-10-CM | POA: Diagnosis not present

## 2016-10-11 DIAGNOSIS — I10 Essential (primary) hypertension: Secondary | ICD-10-CM | POA: Diagnosis not present

## 2016-10-11 DIAGNOSIS — R740 Nonspecific elevation of levels of transaminase and lactic acid dehydrogenase [LDH]: Secondary | ICD-10-CM | POA: Diagnosis not present

## 2016-11-22 DIAGNOSIS — F418 Other specified anxiety disorders: Secondary | ICD-10-CM | POA: Diagnosis not present

## 2016-11-22 DIAGNOSIS — Z6833 Body mass index (BMI) 33.0-33.9, adult: Secondary | ICD-10-CM | POA: Diagnosis not present

## 2016-11-22 DIAGNOSIS — I10 Essential (primary) hypertension: Secondary | ICD-10-CM | POA: Diagnosis not present

## 2017-01-05 DIAGNOSIS — M84374A Stress fracture, right foot, initial encounter for fracture: Secondary | ICD-10-CM | POA: Diagnosis not present

## 2017-01-05 DIAGNOSIS — M76822 Posterior tibial tendinitis, left leg: Secondary | ICD-10-CM | POA: Diagnosis not present

## 2017-01-05 DIAGNOSIS — M76812 Anterior tibial syndrome, left leg: Secondary | ICD-10-CM | POA: Diagnosis not present

## 2017-01-05 DIAGNOSIS — M7752 Other enthesopathy of left foot: Secondary | ICD-10-CM | POA: Diagnosis not present

## 2017-01-05 DIAGNOSIS — M19072 Primary osteoarthritis, left ankle and foot: Secondary | ICD-10-CM | POA: Diagnosis not present

## 2017-01-05 DIAGNOSIS — E559 Vitamin D deficiency, unspecified: Secondary | ICD-10-CM | POA: Diagnosis not present

## 2017-01-12 DIAGNOSIS — M76812 Anterior tibial syndrome, left leg: Secondary | ICD-10-CM | POA: Diagnosis not present

## 2017-01-12 DIAGNOSIS — M84374D Stress fracture, right foot, subsequent encounter for fracture with routine healing: Secondary | ICD-10-CM | POA: Diagnosis not present

## 2017-01-26 DIAGNOSIS — M84374D Stress fracture, right foot, subsequent encounter for fracture with routine healing: Secondary | ICD-10-CM | POA: Diagnosis not present

## 2017-02-17 DIAGNOSIS — R300 Dysuria: Secondary | ICD-10-CM | POA: Diagnosis not present

## 2017-04-27 ENCOUNTER — Ambulatory Visit: Payer: BLUE CROSS/BLUE SHIELD | Admitting: Nurse Practitioner

## 2017-05-11 DIAGNOSIS — I10 Essential (primary) hypertension: Secondary | ICD-10-CM | POA: Diagnosis not present

## 2017-05-11 DIAGNOSIS — Z0000 Encounter for general adult medical examination without abnormal findings: Secondary | ICD-10-CM | POA: Diagnosis not present

## 2017-05-12 DIAGNOSIS — Z0000 Encounter for general adult medical examination without abnormal findings: Secondary | ICD-10-CM | POA: Diagnosis not present

## 2017-05-12 DIAGNOSIS — I10 Essential (primary) hypertension: Secondary | ICD-10-CM | POA: Diagnosis not present

## 2017-05-16 DIAGNOSIS — R946 Abnormal results of thyroid function studies: Secondary | ICD-10-CM | POA: Diagnosis not present

## 2017-05-16 NOTE — Progress Notes (Signed)
56 y.o. G43P2012 Divorced Caucasian F here for annual exam.  Has experienced a really tough year.  Son died of overdose.  She had to call her ex-husband and tell him.  This was first time they've communicated in 2 1/2 years.  Son has struggled with drug issues in the past.  Had moved to [place].  Was doing work in [employer].  Pt really didn't want him to go.  Son called his brother before he died.  It was the middle of the night and brother didn't answer.  Now he has a lot of guilt so pt worrying about him as well.  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  Pt having some stressors this morning with snakes in their office.  Pt reports office encouraged her to seek counseling a few months ago.  Seeing someone through Hospice.  Not sure this is a great fit as pt cries all the way there, through the appts, and then feels like a  "wreck" afterwards.  Reports having a lot of anxiety.  Sometimes feels like she can hardly catch her breath.  Saw Dr. Ardeth Perfect who put her one Wellbutrin.  Doesn't feel like this has helped the anxiety.  Not crying as much but this wasn't the worse problem.  Feels the anxiety is "crippling at times".  D/w pt treatment options.  Feels she needs to add to the Wellbutrin, not stop it or change it.  Options and side effects discussed.  Has questions about menopause.  Occasionally has hot flashes but these are mild.  Wonders if "there".  Had hysterectomy so does not have vaginal bleeding.    Has really worked on weight loss this year.    Seeing Dr. Ardeth Perfect tomorrow.  Had blood work last week.  Patient's last menstrual period was 03/06/2012 (exact date).          Sexually active: Yes.    The current method of family planning is status post hysterectomy.    Exercising: Yes.    walking Smoker:  Former smoker   Health Maintenance: Pap:  05/10/16 colposcopy- no biopsies taken, 04/16/16 ASCUS, HR HPV positive, 04/08/15 negative, HR HPV positive- 16/18/45 negative  History of  abnormal Pap:  yes MMG:  08/12/16 BIRADS 1 negative  Colonoscopy:  never BMD:   never TDaP:  04/01/14  Pneumonia vaccine(s):  never Zostavax:   never Hep C testing: 04/26/16 negative  Screening Labs: PCP, Hb today: PCP, Urine today: PCP   reports that she quit smoking about 28 years ago. Her smoking use included Cigarettes. She has a 5.00 pack-year smoking history. She has never used smokeless tobacco. She reports that she drinks about 3.6 oz of alcohol per week . She reports that she does not use drugs.  Past Medical History:  Diagnosis Date  . Anxiety   . Arthritis 2016  . Dyspareunia   . Encounter for blood transfusion 1996   with childbirth-[place]  . Fibroid    Resolved by [facility] 03/06/12  . 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 SVD (spontaneous vaginal delivery)    x 2    Past Surgical History:  Procedure Laterality Date  . ABDOMINAL HYSTERECTOMY  02/2012   TLH  . CYSTOSCOPY  03/06/2012   Procedure: CYSTOSCOPY;  Surgeon: Lyman Speller, MD;  Location: [facility];  Service: Gynecology;  Laterality: N/A;  . fibroma on tongue  2012  . KNEE ARTHROSCOPY  as teen   left  .  PLANTAR FASCIA RELEASE Right 05/2013  . VAGINAL DELIVERY     x2  . WISDOM TOOTH EXTRACTION      Current Outpatient Prescriptions  Medication Sig Dispense Refill  . ALPRAZolam (XANAX) 0.5 MG tablet Take 1 tablet (0.5 mg total) by mouth at bedtime as needed for anxiety. 30 tablet 0  . buPROPion (WELLBUTRIN SR) 150 MG 12 hr tablet 2 (two) times daily.   0  . Desoximetasone 0.05 % GEL Apply 1 application topically 2 (two) times daily as needed. 60 g 0  . lisinopril (PRINIVIL,ZESTRIL) 10 MG tablet   0  . Vitamin D, Ergocalciferol, (DRISDOL) 50000 units CAPS capsule Take 1 capsule (50,000 Units total) by mouth every 7 (seven) days. 30 capsule 1   No current facility-administered medications for this visit.      Family History  Problem Relation Age of Onset  . Cancer Mother        melanoma   . Osteoarthritis Mother   . Scleroderma Mother   . Raynaud syndrome Mother   . Diabetes Maternal Grandmother   . Thyroid disease Maternal Grandmother   . Cancer Maternal Grandfather        pancreatic cancer    ROS:  Pertinent items are noted in HPI.  Otherwise, a comprehensive ROS was negative.  Exam:   BP 130/90 (BP Location: Right Arm, Patient Position: Sitting, Cuff Size: Normal)   Pulse 76   Resp 14   Ht 5\' 6"  (1.676 m)   Wt 174 lb 4 oz (79 kg)   LMP 03/06/2012 (Exact Date)   BMI 28.12 kg/m    Height: 5\' 6"  (167.6 cm)  Ht Readings from Last 3 Encounters:  05/17/17 5\' 6"  (1.676 m)  05/10/16 5\' 5"  (1.651 m)  04/26/16 5\' 5"  (1.651 m)    General appearance: alert, cooperative and appears stated age Head: Normocephalic, without obvious abnormality, atraumatic Neck: no adenopathy, supple, symmetrical, trachea midline and thyroid normal to inspection and palpation Lungs: clear to auscultation bilaterally Breasts: normal appearance, no masses or tenderness Heart: regular rate and rhythm Abdomen: soft, non-tender; bowel sounds normal; no masses,  no organomegaly Extremities: extremities normal, atraumatic, no cyanosis or edema Skin: Skin color, texture, turgor normal. No rashes or lesions Lymph nodes: Cervical, supraclavicular, and axillary nodes normal. No abnormal inguinal nodes palpated Neurologic: Grossly normal   Pelvic: External genitalia:  no lesions              Urethra:  normal appearing urethra with no masses, tenderness or lesions              Bartholins and Skenes: normal                 Vagina: normal appearing vagina with normal color and discharge, no lesions              Cervix: absent              Pap taken: Yes.   Bimanual Exam:  Uterus:  uterus absent              Adnexa: no mass, fullness, tenderness               Rectovaginal: Confirms               Anus:  normal sphincter tone, no lesions  Chaperone was present for exam.  A:  Well Woman with  normal exam PMP, no HRT S/P TLH/bilateral salpingectomy/cystoscopy 6/13 Hypertension Loss of son this last year  with significantly worsening anxiety H/O ASCUS pap with +HR HPV  P:   Mammogram guidelines reviewed Colonoscopy referral made for pt.  She knows she is overdue.   pap smear and HR HPV obtained FSH obtained today Will start prozac 10mg  daily.  #30/1RF.  Pt to give update in 2-3 weeks.  Recheck 2 months. Feel pt may need change in therapist.  She agrees and will request new therapist.  Knows I can give her names if wants to see someone not with Hospice.  ~20 minutes spent with patient all in ace to face discussion of grief and anxiety.  This was in additional to her AEX.

## 2017-05-17 ENCOUNTER — Other Ambulatory Visit (HOSPITAL_COMMUNITY)
Admission: RE | Admit: 2017-05-17 | Discharge: 2017-05-17 | Disposition: A | Discharge status: Home / Self Care | Payer: BLUE CROSS/BLUE SHIELD | Source: Ambulatory Visit | Attending: Obstetrics & Gynecology | Admitting: Obstetrics & Gynecology

## 2017-05-17 ENCOUNTER — Ambulatory Visit (INDEPENDENT_AMBULATORY_CARE_PROVIDER_SITE_OTHER): Payer: BLUE CROSS/BLUE SHIELD | Admitting: Obstetrics & Gynecology

## 2017-05-17 ENCOUNTER — Encounter: Payer: Self-pay | Admitting: Obstetrics & Gynecology

## 2017-05-17 VITALS — BP 130/90 | HR 76 | Resp 14 | Ht 66.0 in | Wt 174.2 lb

## 2017-05-17 DIAGNOSIS — I10 Essential (primary) hypertension: Secondary | ICD-10-CM

## 2017-05-17 DIAGNOSIS — Z124 Encounter for screening for malignant neoplasm of cervix: Secondary | ICD-10-CM | POA: Insufficient documentation

## 2017-05-17 DIAGNOSIS — R87811 Vaginal high risk human papillomavirus (HPV) DNA test positive: Secondary | ICD-10-CM

## 2017-05-17 DIAGNOSIS — R87620 Atypical squamous cells of undetermined significance on cytologic smear of vagina (ASC-US): Secondary | ICD-10-CM | POA: Insufficient documentation

## 2017-05-17 DIAGNOSIS — Z1211 Encounter for screening for malignant neoplasm of colon: Secondary | ICD-10-CM | POA: Diagnosis not present

## 2017-05-17 DIAGNOSIS — Z01419 Encounter for gynecological examination (general) (routine) without abnormal findings: Secondary | ICD-10-CM

## 2017-05-17 DIAGNOSIS — R232 Flushing: Secondary | ICD-10-CM | POA: Diagnosis not present

## 2017-05-17 MED ORDER — FLUOXETINE HCL 10 MG PO TABS: 10.0000 mg | ORAL_TABLET | Freq: Every day | ORAL | 12 refills | Status: DC

## 2017-05-17 MED ORDER — VITAMIN D (ERGOCALCIFEROL) 1.25 MG (50000 UNIT) PO CAPS: 50000.0000 [IU] | ORAL_CAPSULE | ORAL | 4 refills | Status: DC

## 2017-05-18 DIAGNOSIS — F331 Major depressive disorder, recurrent, moderate: Secondary | ICD-10-CM | POA: Diagnosis not present

## 2017-05-18 DIAGNOSIS — I10 Essential (primary) hypertension: Secondary | ICD-10-CM | POA: Diagnosis not present

## 2017-05-18 DIAGNOSIS — Z1389 Encounter for screening for other disorder: Secondary | ICD-10-CM | POA: Diagnosis not present

## 2017-05-18 DIAGNOSIS — E559 Vitamin D deficiency, unspecified: Secondary | ICD-10-CM | POA: Diagnosis not present

## 2017-05-18 DIAGNOSIS — Z0000 Encounter for general adult medical examination without abnormal findings: Secondary | ICD-10-CM | POA: Diagnosis not present

## 2017-05-18 DIAGNOSIS — Z6829 Body mass index (BMI) 29.0-29.9, adult: Secondary | ICD-10-CM | POA: Diagnosis not present

## 2017-05-18 LAB — FOLLICLE STIMULATING HORMONE: FSH: 84.3 m[IU]/mL

## 2017-05-19 LAB — CYTOLOGY - PAP
Diagnosis: NEGATIVE
HPV (WINDOPATH): DETECTED — AB
HPV 16/18/45 genotyping: NEGATIVE

## 2017-05-23 ENCOUNTER — Telehealth: Payer: Self-pay | Admitting: *Deleted

## 2017-05-23 DIAGNOSIS — B977 Papillomavirus as the cause of diseases classified elsewhere: Secondary | ICD-10-CM

## 2017-05-23 NOTE — Telephone Encounter (Signed)
Left message to call Dystany Duffy at [phone].  

## 2017-05-23 NOTE — Telephone Encounter (Signed)
Pt returning your call

## 2017-05-23 NOTE — Telephone Encounter (Signed)
Notes recorded by Burnice Logan, RN on 05/23/2017 at 3:30 PM EDT Left message to call Sharee Pimple at [phone]. See telephone encounter dated 05/23/17.  ------  Notes recorded by Megan Salon, MD on 05/20/2017 at 2:04 PM EDT Please let pt know her pap is normal but the HR HPV is positive. H/o ASCUS with + HR HPV last year. Needs repeat colposcopy of the vagina. Her [place] is in full menopausal range. She is really not having a lot of symptoms, so this is good.

## 2017-05-31 NOTE — Telephone Encounter (Signed)
Left message to call Iva Posten at [phone].  

## 2017-06-02 NOTE — Telephone Encounter (Signed)
Spoke with patient. Results given as seen below. Patient verbalizes understanding. Colposcopy scheduled for 06/07/2017 at 3:30 pm with Dr.Miller. Patient is agreeable to date and time. Order placed.

## 2017-06-05 ENCOUNTER — Emergency Department (HOSPITAL_COMMUNITY)
Admission: EM | Admit: 2017-06-05 | Discharge: 2017-06-05 | Disposition: A | Discharge status: Home / Self Care | Payer: BLUE CROSS/BLUE SHIELD | Attending: Emergency Medicine | Admitting: Emergency Medicine

## 2017-06-05 ENCOUNTER — Emergency Department (HOSPITAL_COMMUNITY): Payer: BLUE CROSS/BLUE SHIELD

## 2017-06-05 ENCOUNTER — Encounter (HOSPITAL_COMMUNITY): Payer: Self-pay

## 2017-06-05 DIAGNOSIS — Z79899 Other long term (current) drug therapy: Secondary | ICD-10-CM | POA: Diagnosis not present

## 2017-06-05 DIAGNOSIS — S62620B Displaced fracture of medial phalanx of right index finger, initial encounter for open fracture: Secondary | ICD-10-CM | POA: Diagnosis not present

## 2017-06-05 DIAGNOSIS — S62630B Displaced fracture of distal phalanx of right index finger, initial encounter for open fracture: Secondary | ICD-10-CM | POA: Diagnosis not present

## 2017-06-05 DIAGNOSIS — W3400XA Accidental discharge from unspecified firearms or gun, initial encounter: Secondary | ICD-10-CM | POA: Insufficient documentation

## 2017-06-05 DIAGNOSIS — S66120A Laceration of flexor muscle, fascia and tendon of right index finger at wrist and hand level, initial encounter: Secondary | ICD-10-CM | POA: Diagnosis not present

## 2017-06-05 DIAGNOSIS — Z87891 Personal history of nicotine dependence: Secondary | ICD-10-CM | POA: Diagnosis not present

## 2017-06-05 DIAGNOSIS — Y999 Unspecified external cause status: Secondary | ICD-10-CM | POA: Insufficient documentation

## 2017-06-05 DIAGNOSIS — S62609B Fracture of unspecified phalanx of unspecified finger, initial encounter for open fracture: Secondary | ICD-10-CM

## 2017-06-05 DIAGNOSIS — I10 Essential (primary) hypertension: Secondary | ICD-10-CM | POA: Insufficient documentation

## 2017-06-05 DIAGNOSIS — Y939 Activity, unspecified: Secondary | ICD-10-CM | POA: Diagnosis not present

## 2017-06-05 DIAGNOSIS — S6991XA Unspecified injury of right wrist, hand and finger(s), initial encounter: Secondary | ICD-10-CM | POA: Diagnosis not present

## 2017-06-05 DIAGNOSIS — S6981XA Other specified injuries of right wrist, hand and finger(s), initial encounter: Secondary | ICD-10-CM | POA: Diagnosis not present

## 2017-06-05 DIAGNOSIS — S61212A Laceration without foreign body of right middle finger without damage to nail, initial encounter: Secondary | ICD-10-CM | POA: Diagnosis not present

## 2017-06-05 DIAGNOSIS — Y929 Unspecified place or not applicable: Secondary | ICD-10-CM | POA: Diagnosis not present

## 2017-06-05 MED ORDER — LIDOCAINE-EPINEPHRINE (PF) 2 %-1:200000 IJ SOLN
20.0000 mL | Freq: Once | INTRAMUSCULAR | Status: AC
  Administered 2017-06-05: 20 mL via INTRADERMAL
  Filled 2017-06-05: qty 20

## 2017-06-05 MED ORDER — AMOXICILLIN-POT CLAVULANATE 875-125 MG PO TABS: 1.0000 | ORAL_TABLET | Freq: Two times a day (BID) | ORAL | 0 refills | Status: DC
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
## 2017-06-05 NOTE — ED Notes (Signed)
Bed: WTR7 Expected date:  Expected time:  Means of arrival:  Comments: 

## 2017-06-05 NOTE — Discharge Instructions (Signed)
No scrubbing the stitches.  They likely will come out in about a week.  Follow up with hand surgery. Return for redness, drainage or fever

## 2017-06-06 ENCOUNTER — Telehealth: Payer: Self-pay | Admitting: Obstetrics & Gynecology

## 2017-06-06 NOTE — Telephone Encounter (Signed)
Recall placed for 07/06/17. Will close encounter.

## 2017-06-06 NOTE — Telephone Encounter (Signed)
Can we put in recall for one month to make sure she calls to reschedule?  Thanks.
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
## 2017-06-06 NOTE — Telephone Encounter (Signed)
Spoke with patient, call to reschedule coplo. Reports hand injury over the weekend and is unsure of course of treatment until f/u appointment tomorrow. Patient declined to reschedule at this time until further evaluation of hand is completed. Advised patient would update Dr. Sabra Heck and return call with any additional recommendations.   Dr. Sabra Heck -routing [place].

## 2017-06-07 ENCOUNTER — Ambulatory Visit: Payer: BLUE CROSS/BLUE SHIELD | Admitting: Obstetrics & Gynecology

## 2017-06-07 DIAGNOSIS — S62620B Displaced fracture of medial phalanx of right index finger, initial encounter for open fracture: Secondary | ICD-10-CM | POA: Diagnosis not present

## 2017-06-07 DIAGNOSIS — S61200D Unspecified open wound of right index finger without damage to nail, subsequent encounter: Secondary | ICD-10-CM | POA: Diagnosis not present

## 2017-06-07 DIAGNOSIS — M79644 Pain in right finger(s): Secondary | ICD-10-CM | POA: Diagnosis not present

## 2017-06-16 DIAGNOSIS — S62620B Displaced fracture of medial phalanx of right index finger, initial encounter for open fracture: Secondary | ICD-10-CM | POA: Diagnosis not present

## 2017-06-16 DIAGNOSIS — M25649 Stiffness of unspecified hand, not elsewhere classified: Secondary | ICD-10-CM | POA: Diagnosis not present

## 2017-06-16 DIAGNOSIS — M79644 Pain in right finger(s): Secondary | ICD-10-CM | POA: Diagnosis not present

## 2017-06-16 DIAGNOSIS — S61200D Unspecified open wound of right index finger without damage to nail, subsequent encounter: Secondary | ICD-10-CM | POA: Diagnosis not present

## 2017-06-24 DIAGNOSIS — S61200D Unspecified open wound of right index finger without damage to nail, subsequent encounter: Secondary | ICD-10-CM | POA: Diagnosis not present

## 2017-06-24 DIAGNOSIS — M79644 Pain in right finger(s): Secondary | ICD-10-CM | POA: Diagnosis not present

## 2017-06-24 DIAGNOSIS — S62620B Displaced fracture of medial phalanx of right index finger, initial encounter for open fracture: Secondary | ICD-10-CM | POA: Diagnosis not present

## 2017-06-30 DIAGNOSIS — S62620D Displaced fracture of medial phalanx of right index finger, subsequent encounter for fracture with routine healing: Secondary | ICD-10-CM | POA: Diagnosis not present

## 2017-06-30 DIAGNOSIS — S62620B Displaced fracture of medial phalanx of right index finger, initial encounter for open fracture: Secondary | ICD-10-CM | POA: Diagnosis not present

## 2017-07-01 DIAGNOSIS — M25641 Stiffness of right hand, not elsewhere classified: Secondary | ICD-10-CM | POA: Diagnosis not present

## 2017-07-01 DIAGNOSIS — M79644 Pain in right finger(s): Secondary | ICD-10-CM | POA: Diagnosis not present

## 2017-07-01 DIAGNOSIS — S62620B Displaced fracture of medial phalanx of right index finger, initial encounter for open fracture: Secondary | ICD-10-CM | POA: Diagnosis not present

## 2017-07-14 DIAGNOSIS — S62620B Displaced fracture of medial phalanx of right index finger, initial encounter for open fracture: Secondary | ICD-10-CM | POA: Diagnosis not present

## 2017-07-14 DIAGNOSIS — M25641 Stiffness of right hand, not elsewhere classified: Secondary | ICD-10-CM | POA: Diagnosis not present

## 2017-07-14 DIAGNOSIS — M79644 Pain in right finger(s): Secondary | ICD-10-CM | POA: Diagnosis not present

## 2017-07-28 DIAGNOSIS — M25641 Stiffness of right hand, not elsewhere classified: Secondary | ICD-10-CM | POA: Diagnosis not present

## 2017-07-28 DIAGNOSIS — S62620B Displaced fracture of medial phalanx of right index finger, initial encounter for open fracture: Secondary | ICD-10-CM | POA: Diagnosis not present

## 2017-07-28 DIAGNOSIS — M79644 Pain in right finger(s): Secondary | ICD-10-CM | POA: Diagnosis not present

## 2017-07-28 DIAGNOSIS — S62620D Displaced fracture of medial phalanx of right index finger, subsequent encounter for fracture with routine healing: Secondary | ICD-10-CM | POA: Diagnosis not present

## 2017-08-02 ENCOUNTER — Telehealth: Payer: Self-pay | Admitting: *Deleted

## 2017-08-02 NOTE — Telephone Encounter (Signed)
See telephone encounter dated 08/02/17.

## 2017-08-02 NOTE — Telephone Encounter (Signed)
Patient in recall for colposcopy. Patient has not scheduled colposcopy. Please follow up with patient regarding scheduling

## 2017-08-02 NOTE — Telephone Encounter (Signed)
Left message to call Sharee Pimple at [phone].   August 02, 2017   8:47 AM  Nicholes Rough, LPN routed this conversation to Me  Lum Keas Sula Rumple, LPN   0:38 AM  Note  Patient in recall for colposcopy. Patient has not scheduled colposcopy. Please follow up with patient regarding scheduling

## 2017-08-08 NOTE — Telephone Encounter (Signed)
Left detailed message, ok per current dpr. Please return call to [facility] in regards to scheduling colposcopy.

## 2017-08-12 ENCOUNTER — Other Ambulatory Visit: Payer: Self-pay | Admitting: Obstetrics & Gynecology

## 2017-08-12 DIAGNOSIS — Z1231 Encounter for screening mammogram for malignant neoplasm of breast: Secondary | ICD-10-CM

## 2017-08-16 NOTE — Telephone Encounter (Signed)
Dr. Sabra Heck -attempted to reach x2, no return call, please advise?

## 2017-08-17 ENCOUNTER — Ambulatory Visit
Admission: RE | Admit: 2017-08-17 | Discharge: 2017-08-17 | Disposition: A | Discharge status: Home / Self Care | Payer: BLUE CROSS/BLUE SHIELD | Source: Ambulatory Visit | Attending: Obstetrics & Gynecology | Admitting: Obstetrics & Gynecology

## 2017-08-17 DIAGNOSIS — Z1231 Encounter for screening mammogram for malignant neoplasm of breast: Secondary | ICD-10-CM | POA: Diagnosis not present

## 2017-08-17 NOTE — Telephone Encounter (Signed)
Gay Filler, will you please try and call this pt one final time.  Will need 30 day letter if no response.

## 2017-08-17 NOTE — Telephone Encounter (Signed)
Call to patient. States she has meant to return calls but has been busy.  Requests appointment before 08/27/17 due to insurance issues. Appointment scheduled for 08-26-17 at 4pm. Instructed to take Motrin 800 mg one hour prior with food.   Encounter closed.

## 2017-08-26 ENCOUNTER — Ambulatory Visit (INDEPENDENT_AMBULATORY_CARE_PROVIDER_SITE_OTHER): Payer: BLUE CROSS/BLUE SHIELD | Admitting: Obstetrics & Gynecology

## 2017-08-26 ENCOUNTER — Encounter: Payer: Self-pay | Admitting: Obstetrics & Gynecology

## 2017-08-26 ENCOUNTER — Other Ambulatory Visit: Payer: Self-pay

## 2017-08-26 DIAGNOSIS — B977 Papillomavirus as the cause of diseases classified elsewhere: Secondary | ICD-10-CM

## 2017-08-26 MED ORDER — FOLIC ACID 1 MG PO TABS: 1.0000 mg | ORAL_TABLET | Freq: Every day | ORAL | 4 refills | Status: DC

## 2017-08-26 MED ORDER — CICLOPIROX 8 % EX SOLN: Freq: Every day | CUTANEOUS | 3 refills | Status: DC

## 2017-08-26 NOTE — Progress Notes (Signed)
56 y.o. Divorced Caucasianfemale here for colposcopy with possible biopsies and/or ECC due to +HR HPV obtained with Pap smear at [facility].  Pt had not returned for colposcopy and was called by nursing supervisor.  Here now for evaluation.     Having issues with fungus on toe nails.  Would like suggestions.  Also, needs RF or folic acid.  Patient's last menstrual period was 03/06/2012 (exact date).          Sexually active: No.  The current method of family planning is status post hysterectomy.     Patient has been counseled about results and procedure.  Risks and benefits have bene reviewed including immediate and/or delayed bleeding, infection, cervical scaring from procedure, possibility of needing additional follow up as well as treatment.  rare risks of missing a lesion discussed as well.  All questions answered.  Pt ready to proceed.  BP 118/60 (BP Location: Right Arm, Patient Position: Sitting, Cuff Size: Normal)   Pulse 76   Resp 16   Wt 176 lb (79.8 kg)   LMP 03/06/2012 (Exact Date)   BMI 28.41 kg/m   Physical Exam  Constitutional: She is oriented to person, place, and time. She appears well-developed and well-nourished.  Genitourinary: Vagina normal. There is no rash, tenderness, lesion or injury on the right labia. There is no rash, tenderness, lesion or injury on the left labia.  Lymphadenopathy:       Right: No inguinal adenopathy present.       Left: No inguinal adenopathy present.  Neurological: She is alert and oriented to person, place, and time.  Skin: Skin is warm and dry.  Psychiatric: She has a normal mood and affect.    Speculum placed.  3% acetic acid applied to cervix for >45 seconds.  Cervix surgically absent.  Vaginal apex bathed in acetic acid 3%.  Then using both 7.5X and 15X magnification, vaginal cuff and upper 1/3 of vagina visualized.  No lesions noted.  Then Lugols solution was used.  Entire vaginal including apex/cuff was visualized.  NO lesions noted.  No  biopsies obtained.  Pt tolerated procedure well.  Speculum removed.   Assessment:  +HR HPV of vagina Toenail fungus  Plan: Pt will return for [facility] and pap/HR HPV at that time. Folic acid 1mg  daily rx to pharmacy Penlac solution to toenails nightly x 7 nights, then clean off and repeat.  Aware must do this for at least 12 weeks.  Instructions given.

## 2017-12-19 DIAGNOSIS — L980 Pyogenic granuloma: Secondary | ICD-10-CM | POA: Diagnosis not present

## 2018-02-23 DIAGNOSIS — M67471 Ganglion, right ankle and foot: Secondary | ICD-10-CM | POA: Diagnosis not present

## 2018-02-23 DIAGNOSIS — M67472 Ganglion, left ankle and foot: Secondary | ICD-10-CM | POA: Diagnosis not present

## 2018-02-23 DIAGNOSIS — M7752 Other enthesopathy of left foot: Secondary | ICD-10-CM | POA: Diagnosis not present

## 2018-02-23 DIAGNOSIS — M19072 Primary osteoarthritis, left ankle and foot: Secondary | ICD-10-CM | POA: Diagnosis not present

## 2018-02-23 DIAGNOSIS — M7751 Other enthesopathy of right foot: Secondary | ICD-10-CM | POA: Diagnosis not present

## 2018-02-23 DIAGNOSIS — M19071 Primary osteoarthritis, right ankle and foot: Secondary | ICD-10-CM | POA: Diagnosis not present

## 2018-03-02 DIAGNOSIS — M67471 Ganglion, right ankle and foot: Secondary | ICD-10-CM | POA: Diagnosis not present

## 2018-03-02 DIAGNOSIS — M67472 Ganglion, left ankle and foot: Secondary | ICD-10-CM | POA: Diagnosis not present

## 2018-03-20 DIAGNOSIS — M67471 Ganglion, right ankle and foot: Secondary | ICD-10-CM | POA: Diagnosis not present

## 2018-03-20 DIAGNOSIS — M659 Synovitis and tenosynovitis, unspecified: Secondary | ICD-10-CM | POA: Diagnosis not present

## 2018-03-27 DIAGNOSIS — M7751 Other enthesopathy of right foot: Secondary | ICD-10-CM | POA: Diagnosis not present

## 2018-03-27 DIAGNOSIS — M67471 Ganglion, right ankle and foot: Secondary | ICD-10-CM | POA: Diagnosis not present

## 2018-04-21 DIAGNOSIS — N390 Urinary tract infection, site not specified: Secondary | ICD-10-CM | POA: Diagnosis not present

## 2018-04-30 DIAGNOSIS — N390 Urinary tract infection, site not specified: Secondary | ICD-10-CM | POA: Diagnosis not present

## 2018-05-03 ENCOUNTER — Other Ambulatory Visit: Payer: Self-pay | Admitting: Internal Medicine

## 2018-05-03 DIAGNOSIS — R319 Hematuria, unspecified: Secondary | ICD-10-CM

## 2018-05-03 DIAGNOSIS — R110 Nausea: Secondary | ICD-10-CM | POA: Diagnosis not present

## 2018-05-03 DIAGNOSIS — R509 Fever, unspecified: Secondary | ICD-10-CM | POA: Diagnosis not present

## 2018-05-03 DIAGNOSIS — R1011 Right upper quadrant pain: Secondary | ICD-10-CM | POA: Diagnosis not present

## 2018-05-03 DIAGNOSIS — R3129 Other microscopic hematuria: Secondary | ICD-10-CM | POA: Diagnosis not present

## 2018-05-04 ENCOUNTER — Other Ambulatory Visit: Payer: BLUE CROSS/BLUE SHIELD

## 2018-05-12 DIAGNOSIS — Z0000 Encounter for general adult medical examination without abnormal findings: Secondary | ICD-10-CM | POA: Diagnosis not present

## 2018-05-12 DIAGNOSIS — E559 Vitamin D deficiency, unspecified: Secondary | ICD-10-CM | POA: Diagnosis not present

## 2018-05-12 DIAGNOSIS — R946 Abnormal results of thyroid function studies: Secondary | ICD-10-CM | POA: Diagnosis not present

## 2018-05-18 DIAGNOSIS — Z1331 Encounter for screening for depression: Secondary | ICD-10-CM | POA: Diagnosis not present

## 2018-05-18 DIAGNOSIS — I10 Essential (primary) hypertension: Secondary | ICD-10-CM | POA: Diagnosis not present

## 2018-05-18 DIAGNOSIS — R740 Nonspecific elevation of levels of transaminase and lactic acid dehydrogenase [LDH]: Secondary | ICD-10-CM | POA: Diagnosis not present

## 2018-05-18 DIAGNOSIS — F331 Major depressive disorder, recurrent, moderate: Secondary | ICD-10-CM | POA: Diagnosis not present

## 2018-05-18 DIAGNOSIS — Z1389 Encounter for screening for other disorder: Secondary | ICD-10-CM | POA: Diagnosis not present

## 2018-05-18 DIAGNOSIS — D649 Anemia, unspecified: Secondary | ICD-10-CM | POA: Diagnosis not present

## 2018-05-18 DIAGNOSIS — Z0000 Encounter for general adult medical examination without abnormal findings: Secondary | ICD-10-CM | POA: Diagnosis not present

## 2018-05-26 DIAGNOSIS — N390 Urinary tract infection, site not specified: Secondary | ICD-10-CM | POA: Diagnosis not present

## 2018-07-14 ENCOUNTER — Other Ambulatory Visit: Payer: Self-pay | Admitting: Obstetrics & Gynecology

## 2018-07-14 ENCOUNTER — Ambulatory Visit (INDEPENDENT_AMBULATORY_CARE_PROVIDER_SITE_OTHER): Payer: BLUE CROSS/BLUE SHIELD | Admitting: Obstetrics & Gynecology

## 2018-07-14 ENCOUNTER — Encounter

## 2018-07-14 ENCOUNTER — Encounter: Payer: Self-pay | Admitting: Obstetrics & Gynecology

## 2018-07-14 ENCOUNTER — Other Ambulatory Visit (HOSPITAL_COMMUNITY)
Admission: RE | Admit: 2018-07-14 | Discharge: 2018-07-14 | Disposition: A | Discharge status: Home / Self Care | Payer: BLUE CROSS/BLUE SHIELD | Source: Ambulatory Visit | Attending: Obstetrics & Gynecology | Admitting: Obstetrics & Gynecology

## 2018-07-14 VITALS — BP 110/70 | HR 64 | Resp 16 | Ht 65.0 in | Wt 186.0 lb

## 2018-07-14 DIAGNOSIS — B977 Papillomavirus as the cause of diseases classified elsewhere: Secondary | ICD-10-CM | POA: Diagnosis not present

## 2018-07-14 DIAGNOSIS — Z01419 Encounter for gynecological examination (general) (routine) without abnormal findings: Secondary | ICD-10-CM

## 2018-07-14 DIAGNOSIS — R3915 Urgency of urination: Secondary | ICD-10-CM | POA: Diagnosis not present

## 2018-07-14 DIAGNOSIS — N3000 Acute cystitis without hematuria: Secondary | ICD-10-CM

## 2018-07-14 DIAGNOSIS — Z124 Encounter for screening for malignant neoplasm of cervix: Secondary | ICD-10-CM | POA: Insufficient documentation

## 2018-07-14 LAB — POCT URINALYSIS DIPSTICK
Bilirubin, UA: NEGATIVE
GLUCOSE UA: NEGATIVE
Ketones, UA: NEGATIVE
NITRITE UA: NEGATIVE
Protein, UA: NEGATIVE
RBC UA: NEGATIVE
Urobilinogen, UA: NEGATIVE E.U./dL — AB
pH, UA: 5 (ref 5.0–8.0)

## 2018-07-14 MED ORDER — VITAMIN D (ERGOCALCIFEROL) 1.25 MG (50000 UNIT) PO CAPS
50000.0000 [IU] | ORAL_CAPSULE | ORAL | 4 refills | Status: AC
Start: 2018-07-17 — End: ?

## 2018-07-14 NOTE — Patient Instructions (Signed)
[facility] Phone: [phone] Location: Lower level of [facility] ([address]) Hours: 7:30 a.m. to 6:00 p.m., Monday through Friday.   [facility] Phone: [phone] Location: [address] Hours: 7:30 a.m. to 6:00 p.m., Monday through Friday.  
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
## 2018-07-15 LAB — URINE CULTURE

## 2018-07-15 LAB — URINALYSIS, MICROSCOPIC ONLY: CASTS: NONE SEEN /LPF

## 2018-07-17 ENCOUNTER — Telehealth: Payer: Self-pay | Admitting: *Deleted

## 2018-07-17 NOTE — Telephone Encounter (Signed)
-----   Message from Megan Salon, MD sent at 07/17/2018 12:58 PM EDT ----- Please let pt know her urine micro and culture were negative.  I will route the culture to you as well.  She is going to start vaginal estrogen as we discussed in the office last week.  I would recommend estrace vaginal cream 1 gram pv twice weekly.  #42.5 gram tube/3RF.  Please have her come back for recheck in 3 months and have her call with any new urinary symptoms.  Thanks.

## 2018-07-17 NOTE — Telephone Encounter (Signed)
LM for pt to call back.

## 2018-07-18 LAB — CYTOLOGY - PAP
DIAGNOSIS: NEGATIVE
HPV: NOT DETECTED

## 2018-07-18 NOTE — Telephone Encounter (Signed)
Second attempt to contact patient. Left voicemail to call back  

## 2018-07-20 ENCOUNTER — Other Ambulatory Visit: Payer: Self-pay | Admitting: *Deleted

## 2018-07-20 MED ORDER — ESTRADIOL 0.1 MG/GM VA CREA: 1.0000 | TOPICAL_CREAM | VAGINAL | 3 refills | Status: DC

## 2018-07-20 NOTE — Telephone Encounter (Signed)
Notes recorded by Polly Cobia, CMA on 07/20/2018 at 3:41 PM EDT Pt notified. Verbalized understanding. Rx sent to pharmacy.  OV scheduled 10/23/18

## 2018-10-19 ENCOUNTER — Telehealth: Payer: Self-pay | Admitting: Obstetrics & Gynecology

## 2018-10-19 NOTE — Telephone Encounter (Signed)
Patient cancelled upcoming follow up appointment for 10/23/2018. States she has not been taking estrogen. She is not having any problems; chronic UTI's have cleared up. Will call if needed.

## 2018-10-23 ENCOUNTER — Ambulatory Visit: Payer: BLUE CROSS/BLUE SHIELD | Admitting: Obstetrics & Gynecology

## 2018-10-25 DIAGNOSIS — M2011 Hallux valgus (acquired), right foot: Secondary | ICD-10-CM | POA: Diagnosis not present

## 2018-10-25 DIAGNOSIS — M79672 Pain in left foot: Secondary | ICD-10-CM | POA: Diagnosis not present

## 2018-10-25 DIAGNOSIS — M79671 Pain in right foot: Secondary | ICD-10-CM | POA: Diagnosis not present

## 2018-10-25 DIAGNOSIS — M19072 Primary osteoarthritis, left ankle and foot: Secondary | ICD-10-CM | POA: Diagnosis not present

## 2018-10-25 DIAGNOSIS — M19071 Primary osteoarthritis, right ankle and foot: Secondary | ICD-10-CM | POA: Diagnosis not present

## 2018-10-31 DIAGNOSIS — M25551 Pain in right hip: Secondary | ICD-10-CM | POA: Diagnosis not present

## 2018-10-31 DIAGNOSIS — M7061 Trochanteric bursitis, right hip: Secondary | ICD-10-CM | POA: Diagnosis not present

## 2018-11-01 DIAGNOSIS — M25572 Pain in left ankle and joints of left foot: Secondary | ICD-10-CM | POA: Insufficient documentation

## 2018-11-01 DIAGNOSIS — M79671 Pain in right foot: Secondary | ICD-10-CM | POA: Diagnosis not present

## 2018-12-06 DIAGNOSIS — M19071 Primary osteoarthritis, right ankle and foot: Secondary | ICD-10-CM | POA: Diagnosis not present

## 2018-12-06 DIAGNOSIS — M79671 Pain in right foot: Secondary | ICD-10-CM | POA: Diagnosis not present

## 2018-12-06 DIAGNOSIS — M25572 Pain in left ankle and joints of left foot: Secondary | ICD-10-CM | POA: Diagnosis not present

## 2019-01-22 DIAGNOSIS — Z719 Counseling, unspecified: Secondary | ICD-10-CM | POA: Diagnosis not present

## 2019-07-20 NOTE — Progress Notes (Signed)
58 y.o. [id number] [name] here for annual exam.  Denies vaginal bleeding.  Having issues with pain in hips and feet.  Has seen ortho.  Has seen rheumatologist in the past, Dr. Trudie Reed.    Lab work done in the last month with Dr. Jackelyn Poling.  Vit D was 43.  Cholesterol was 257.  Her mother has scleroderma.  It took several years to finally get a diagnosis.    Patient's last menstrual period was 03/06/2012 (exact date).          Sexually active: Yes.    The current method of family planning is post menopausal status.    Exercising: Yes.    Walking the dog  Smoker:  no  Health Maintenance: Pap: 07/18/18 Neg HR HPV neg            05/17/17 Neg. HR HPV:+detected. #16, 18/45 Neg           04/26/16 ASCUS. HR HPV:+detected.  History of abnormal Pap:  yes MMG:  08/17/17 Density C Bi-rads 1 neg  Colonoscopy:  Willing to do cologuard BMD:   no TDaP:  2015 Pneumonia vaccine(s):  no Shingrix:   no Hep C testing: 04/26/16 Neg Screening Labs: PCP   reports that she quit smoking about 30 years ago. Her smoking use included cigarettes. She has a 5.00 pack-year smoking history. She has never used smokeless tobacco. She reports current alcohol use of about 6.0 standard drinks of alcohol per week. She reports that she does not use drugs.  Past Medical History:  Diagnosis Date  . Anxiety   . Arthritis 2016  . Dyspareunia   . Encounter for blood transfusion 1996   with childbirth-[place]  . Fibroid    Resolved by [facility] 03/06/12  . 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 SVD (spontaneous vaginal delivery)    x 2    Past Surgical History:  Procedure Laterality Date  . CYSTOSCOPY  03/06/2012   Procedure: CYSTOSCOPY;  Surgeon: Lyman Speller, MD;  Location: [place] [facility];  Service: Gynecology;  Laterality: N/A;  . fibroma on tongue  2012  . KNEE ARTHROSCOPY  as teen   left  . LAPAROSCOPIC TOTAL HYSTERECTOMY  02/2012   [facility]  . PLANTAR FASCIA RELEASE Right 05/2013   . WISDOM TOOTH EXTRACTION      Current Outpatient Medications  Medication Sig Dispense Refill  . ALPRAZolam (XANAX) 0.5 MG tablet Take 1 tablet (0.5 mg total) by mouth at bedtime as needed for anxiety. 30 tablet 0  . buPROPion (WELLBUTRIN SR) 150 MG 12 hr tablet Take 150 mg by mouth 2 (two) times daily.   0  . Vitamin D, Ergocalciferol, (DRISDOL) 50000 units CAPS capsule Take 1 capsule (50,000 Units total) by mouth every Monday. 12 capsule 4   No current facility-administered medications for this visit.     Family History  Problem Relation Age of Onset  . Cancer Mother        melanoma  . Osteoarthritis Mother   . Scleroderma Mother   . Raynaud syndrome Mother   . Diabetes Maternal Grandmother   . Thyroid disease Maternal Grandmother   . Cancer Maternal Grandfather        pancreatic cancer  . Breast cancer Neg Hx     Review of Systems  Exam:   Vitals:   07/23/19 1403  BP: (!) 140/100  Pulse: 68  Temp: 97.7 F (36.5 C)  SpO2: 98%  General appearance: alert, cooperative and appears stated age Head: Normocephalic, without obvious abnormality, atraumatic Neck: no adenopathy, supple, symmetrical, trachea midline and thyroid normal to inspection and palpation Lungs: clear to auscultation bilaterally Breasts: normal appearance, no masses or tenderness Heart: regular rate and rhythm Abdomen: soft, non-tender; bowel sounds normal; no masses,  no organomegaly Extremities: extremities normal, atraumatic, no cyanosis or edema Skin: Skin color, texture, turgor normal. No rashes or lesions Lymph nodes: Cervical, supraclavicular, and axillary nodes normal. No abnormal inguinal nodes palpated Neurologic: Grossly normal   Pelvic: External genitalia:  no lesions              Urethra:  normal appearing urethra with no masses, tenderness or lesions              Bartholins and Skenes: normal                 Vagina: normal appearing vagina with normal color and discharge, no  lesions, 2nd degree cystocele              Cervix: absent              Pap taken: Yes.   Bimanual Exam:  Uterus:  uterus absent              Adnexa: normal adnexa and no mass, fullness, tenderness               Rectovaginal: Confirms               Anus:  normal sphincter tone, no lesions  Chaperone was present for exam.  A:  Well Woman with normal exam PMP, no HRT S/p TLH/bilateral salpingectomy/cystoscopy 6/13 Hypertension H/O ASCUS with +HR HPV (most recent pap neg with neg HR HPV) Polyarticular joint pain (worse in bilateral feet) Cystocele  P:   Mammogram guidelines reviewed.  She is aware this is due. pap smear with HR HPV obtained today Cologuard order placed ESR, RF, CCP, ANA She will start checking blood pressures at home (report BP was good with Dr. Jackelyn Poling) Return annually or prn

## 2019-07-23 ENCOUNTER — Ambulatory Visit (INDEPENDENT_AMBULATORY_CARE_PROVIDER_SITE_OTHER): Payer: 59 | Admitting: Obstetrics & Gynecology

## 2019-07-23 ENCOUNTER — Other Ambulatory Visit (HOSPITAL_COMMUNITY)
Admission: RE | Admit: 2019-07-23 | Discharge: 2019-07-23 | Disposition: A | Discharge status: Home / Self Care | Payer: 59 | Source: Ambulatory Visit | Attending: Obstetrics & Gynecology | Admitting: Obstetrics & Gynecology

## 2019-07-23 ENCOUNTER — Encounter: Payer: Self-pay | Admitting: Obstetrics & Gynecology

## 2019-07-23 ENCOUNTER — Other Ambulatory Visit: Payer: Self-pay

## 2019-07-23 VITALS — BP 140/100 | HR 68 | Temp 97.7°F | Ht 65.0 in | Wt 191.6 lb

## 2019-07-23 DIAGNOSIS — Z1151 Encounter for screening for human papillomavirus (HPV): Secondary | ICD-10-CM | POA: Diagnosis not present

## 2019-07-23 DIAGNOSIS — Z01419 Encounter for gynecological examination (general) (routine) without abnormal findings: Secondary | ICD-10-CM | POA: Insufficient documentation

## 2019-07-23 DIAGNOSIS — Z90710 Acquired absence of both cervix and uterus: Secondary | ICD-10-CM | POA: Insufficient documentation

## 2019-07-23 DIAGNOSIS — B977 Papillomavirus as the cause of diseases classified elsewhere: Secondary | ICD-10-CM | POA: Diagnosis not present

## 2019-07-23 DIAGNOSIS — M25572 Pain in left ankle and joints of left foot: Secondary | ICD-10-CM

## 2019-07-23 DIAGNOSIS — M25571 Pain in right ankle and joints of right foot: Secondary | ICD-10-CM

## 2019-07-23 DIAGNOSIS — R3915 Urgency of urination: Secondary | ICD-10-CM

## 2019-07-23 LAB — POCT URINALYSIS DIPSTICK
Bilirubin, UA: NEGATIVE
Blood, UA: NEGATIVE
Glucose, UA: NEGATIVE
Ketones, UA: NEGATIVE
Leukocytes, UA: NEGATIVE
Nitrite, UA: NEGATIVE
Protein, UA: NEGATIVE
Urobilinogen, UA: 0.2 E.U./dL
pH, UA: 5.5 (ref 5.0–8.0)

## 2019-07-23 NOTE — Addendum Note (Signed)
Addended by: Yates Decamp on: 07/23/2019 03:18 PM   Modules accepted: Orders

## 2019-07-24 LAB — RHEUMATOID FACTOR: Rhuematoid fact SerPl-aCnc: <10 IU/mL (ref 0.0–13.9)

## 2019-07-24 LAB — ANA: Anti Nuclear Antibody (ANA): NEGATIVE

## 2019-07-24 LAB — SEDIMENTATION RATE: Sed Rate: 8 mm/hr (ref 0–40)

## 2019-07-25 LAB — CYTOLOGY - PAP
Comment: NEGATIVE
Diagnosis: NEGATIVE
High risk HPV: NEGATIVE

## 2019-07-26 ENCOUNTER — Telehealth: Payer: Self-pay

## 2019-07-26 LAB — ANTI-CCP AB, IGG + IGA (RDL): Anti-CCP Ab, IgG + IgA (RDL): <20 Units (ref <20)

## 2019-07-26 NOTE — Telephone Encounter (Signed)
Tried calling patient with results as seen below. No answer, left message to call me back.

## 2019-07-26 NOTE — Telephone Encounter (Signed)
-----   Message from Megan Salon, MD sent at 07/26/2019  6:44 AM EDT ----- Please let pt know her pap was negative and the high risk HPV was negative as well.  This is two years in a row.  Needs 3 year recall (36 months).  Out of current recall.  Please let her know her sed rate, rheumatoid factor, anti nuclear antibody were negative.  One additional test is still pending.  If the test is abnormal, of course she needs to see rheumatology again but even if it is negative, does she want me to reach out to Dr. Estanislado Pandy to see if she will still see her in consultation?  Thanks.

## 2019-08-06 NOTE — Telephone Encounter (Signed)
The following results were discussed in detail with patient as seen below. Patient states that she has made an appointment with another Orthopedist Raliegh Ip). She would, however, like to have the consult with Rheumatology. Patient requests appointment to be after August 28, 2019.   Patient also requests a new prescription for Clobetasol gel sent to [facility]

## 2019-08-07 ENCOUNTER — Other Ambulatory Visit: Payer: Self-pay | Admitting: Obstetrics & Gynecology

## 2019-08-07 DIAGNOSIS — M25571 Pain in right ankle and joints of right foot: Secondary | ICD-10-CM

## 2019-08-07 DIAGNOSIS — Z8269 Family history of other diseases of the musculoskeletal system and connective tissue: Secondary | ICD-10-CM

## 2019-08-07 DIAGNOSIS — M25572 Pain in left ankle and joints of left foot: Secondary | ICD-10-CM

## 2019-08-07 MED ORDER — CLOBETASOL PROPIONATE 0.05 % EX GEL: CUTANEOUS | 0 refills | Status: DC

## 2019-08-07 NOTE — Telephone Encounter (Signed)
Rx for clobetasol has been done to [facility].  Referral to Dr. Estanislado Pandy has also been placed.  Ok to close encounter.

## 2019-08-25 ENCOUNTER — Encounter: Payer: Self-pay | Admitting: Obstetrics & Gynecology

## 2019-08-25 LAB — COLOGUARD: Cologuard: NEGATIVE

## 2019-08-28 DIAGNOSIS — M25571 Pain in right ankle and joints of right foot: Secondary | ICD-10-CM | POA: Diagnosis not present

## 2019-08-28 DIAGNOSIS — M545 Low back pain: Secondary | ICD-10-CM | POA: Diagnosis not present

## 2019-08-28 DIAGNOSIS — M25572 Pain in left ankle and joints of left foot: Secondary | ICD-10-CM | POA: Diagnosis not present

## 2019-08-28 DIAGNOSIS — M160 Bilateral primary osteoarthritis of hip: Secondary | ICD-10-CM | POA: Diagnosis not present

## 2019-08-30 DIAGNOSIS — M7061 Trochanteric bursitis, right hip: Secondary | ICD-10-CM | POA: Diagnosis not present

## 2019-09-10 ENCOUNTER — Telehealth: Payer: Self-pay

## 2019-09-10 NOTE — Telephone Encounter (Signed)
Tried calling patient to notify of negative Cologuard results. No answer, left message with results on patient's voicemail. Okay per DPR to leave a detailed message at [phone]. Closing encounter.

## 2019-09-18 DIAGNOSIS — M25572 Pain in left ankle and joints of left foot: Secondary | ICD-10-CM | POA: Diagnosis not present

## 2019-09-18 DIAGNOSIS — M25551 Pain in right hip: Secondary | ICD-10-CM | POA: Diagnosis not present

## 2019-09-18 DIAGNOSIS — M25552 Pain in left hip: Secondary | ICD-10-CM | POA: Diagnosis not present

## 2019-11-16 ENCOUNTER — Ambulatory Visit: Payer: BLUE CROSS/BLUE SHIELD | Admitting: Obstetrics & Gynecology
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
## 2019-11-19 ENCOUNTER — Other Ambulatory Visit: Payer: Self-pay

## 2019-11-19 ENCOUNTER — Ambulatory Visit (INDEPENDENT_AMBULATORY_CARE_PROVIDER_SITE_OTHER): Payer: BC Managed Care – PPO | Admitting: Rheumatology

## 2019-11-19 ENCOUNTER — Encounter: Payer: Self-pay | Admitting: Rheumatology

## 2019-11-19 ENCOUNTER — Ambulatory Visit: Payer: Self-pay

## 2019-11-19 VITALS — BP 150/97 | HR 77 | Resp 14 | Ht 65.0 in | Wt 195.6 lb

## 2019-11-19 DIAGNOSIS — Z8269 Family history of other diseases of the musculoskeletal system and connective tissue: Secondary | ICD-10-CM | POA: Diagnosis not present

## 2019-11-19 DIAGNOSIS — Q667 Congenital pes cavus, unspecified foot: Secondary | ICD-10-CM | POA: Diagnosis not present

## 2019-11-19 DIAGNOSIS — M25572 Pain in left ankle and joints of left foot: Secondary | ICD-10-CM

## 2019-11-19 DIAGNOSIS — M7061 Trochanteric bursitis, right hip: Secondary | ICD-10-CM | POA: Diagnosis not present

## 2019-11-19 DIAGNOSIS — F32A Anxiety disorder, unspecified: Secondary | ICD-10-CM

## 2019-11-19 DIAGNOSIS — M25571 Pain in right ankle and joints of right foot: Secondary | ICD-10-CM

## 2019-11-19 DIAGNOSIS — F329 Major depressive disorder, single episode, unspecified: Secondary | ICD-10-CM

## 2019-11-19 DIAGNOSIS — F419 Anxiety disorder, unspecified: Secondary | ICD-10-CM

## 2019-11-19 DIAGNOSIS — I10 Essential (primary) hypertension: Secondary | ICD-10-CM

## 2019-11-19 DIAGNOSIS — E559 Vitamin D deficiency, unspecified: Secondary | ICD-10-CM

## 2019-11-19 DIAGNOSIS — M7062 Trochanteric bursitis, left hip: Secondary | ICD-10-CM

## 2019-11-26 LAB — URIC ACID: Uric Acid, Serum: 6.7 mg/dL (ref 2.5–7.0)

## 2019-11-26 LAB — 14-3-3 ETA PROTEIN: 14-3-3 eta Protein: <0.2 ng/mL (ref <0.2)

## 2019-11-27 NOTE — Progress Notes (Signed)
Uric acid and 14 3 3  eta are normal.  I will discuss results at the follow-up visit.

## 2019-12-10 NOTE — Progress Notes (Deleted)
Office Visit Note  Patient: Meagan Harrington             Date of Birth: March 15, 1961           MRN: [id number]             PCP: Velna Hatchet, MD Referring: Velna Hatchet, MD Visit Date: 12/17/2019 Occupation: @GUAROCC @  Subjective:  No chief complaint on file.   History of Present Illness: Meagan Harrington is a 59 y.o. female ***   Activities of Daily Living:  Patient reports morning stiffness for *** {minute/hour:19697}.   Patient {ACTIONS;DENIES/REPORTS:21021675::"Denies"} nocturnal pain.  Difficulty dressing/grooming: {ACTIONS;DENIES/REPORTS:21021675::"Denies"} Difficulty climbing stairs: {ACTIONS;DENIES/REPORTS:21021675::"Denies"} Difficulty getting out of chair: {ACTIONS;DENIES/REPORTS:21021675::"Denies"} Difficulty using hands for taps, buttons, cutlery, and/or writing: {ACTIONS;DENIES/REPORTS:21021675::"Denies"}  No Rheumatology ROS completed.   PMFS History:  Patient Active Problem List   Diagnosis Date Noted  . Pain in right foot 11/01/2018  . Pain of joint of left ankle and foot 11/01/2018  . Hypertension 05/17/2017  . Postmenopausal estrogen deficiency 04/26/2016  . Situational stress 04/26/2016  . DEPRESSION 04/07/2007    Past Medical History:  Diagnosis Date  . Anxiety   . Arthritis 2016  . Dyspareunia   . Encounter for blood transfusion 1996   with childbirth-[place]  . Fibroid    Resolved by [facility] 03/06/12  . 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 Osteoarthritis Mother   . Scleroderma Mother   . Raynaud syndrome Mother   . Melanoma Mother           . Arthritis Father   . Diabetes Maternal Grandmother   . Thyroid disease Maternal Grandmother   . Cancer Maternal Grandfather        pancreatic cancer  . Drug abuse Son   . Healthy Son   . Breast cancer Neg Hx    Past Surgical History:  Procedure Laterality Date  . CYSTOSCOPY  03/06/2012   Procedure: CYSTOSCOPY;  Surgeon:  Lyman Speller, MD;  Location: [facility];  Service: Gynecology;  Laterality: N/A;  . fibroma on tongue  2012  . KNEE ARTHROSCOPY  as teen   left  . LAPAROSCOPIC TOTAL HYSTERECTOMY  02/2012   [facility]  . PLANTAR FASCIA RELEASE Right 05/2013  . WISDOM TOOTH EXTRACTION     Social History   Social History Narrative  . Not on file   Immunization History  Administered Date(s) Administered  . Influenza Split 10/11/2013  . Influenza-Unspecified 05/06/2018  . Td 12/27/2003  . Tdap 04/01/2014     Objective: Vital Signs: LMP 03/06/2012 (Exact Date)    Physical Exam   Musculoskeletal Exam: ***  CDAI Exam: CDAI Score: - Patient Global: -; Provider Global: - Swollen: -; Tender: - Joint Exam 12/17/2019   No joint exam has been documented for this visit   There is currently no information documented on the homunculus. Go to the Rheumatology activity and complete the homunculus joint exam.  Investigation: No additional findings.  Imaging: XR Foot 2 Views Left  Result Date: 11/19/2019 First MTP narrowing and valgus deformity was noted.  PIP and DIP narrowing was noted.  None of the other MTP shoe narrowing or erosive changes.  No intertarsal narrowing or tibial tarsal narrowing was noted.  Dorsal spurring was noted.  Inferior calcaneal spur was noted. Impression: These findings are consistent with osteoarthritis of the foot.  XR Foot 2 Views Right  Result  Date: 11/19/2019 First MTP narrowing and valgus deformity was noted.  PIP and DIP narrowing was noted.  None of the other MTP shoe narrowing or erosive changes.  No intertarsal narrowing or tibial tarsal narrowing was noted.  Dorsal spurring was noted.  Inferior calcaneal spur was noted. Impression: These findings are consistent with osteoarthritis of the foot.   Recent Labs: Lab Results  Component Value Date   WBC 5.1 04/26/2016   HGB 13.5 04/26/2016   PLT 238 04/26/2016   NA 138 04/26/2016   K 4.5 04/26/2016   CL 105  04/26/2016   CO2 24 04/26/2016   GLUCOSE 93 04/26/2016   BUN 14 04/26/2016   CREATININE 0.82 04/26/2016   BILITOT 0.4 05/21/2016   ALKPHOS 54 05/21/2016   AST 31 05/21/2016   ALT 38 (H) 05/21/2016   PROT 6.2 05/21/2016   ALBUMIN 3.9 05/21/2016   CALCIUM 9.3 04/26/2016    Speciality Comments: No specialty comments available.  Procedures:  No procedures performed Allergies: Amoxicillin and Naproxen sodium   Assessment / Plan:     Visit Diagnoses: No diagnosis found.  Orders: No orders of the defined types were placed in this encounter.  No orders of the defined types were placed in this encounter.   Face-to-face time spent with patient was *** minutes. Greater than 50% of time was spent in counseling and coordination of care.  Follow-Up Instructions: No follow-ups on file.   Ofilia Neas, PA-C  Note - This record has been created using Dragon software.  Chart creation errors have been sought, but may not always  have been located. Such creation errors do not reflect on  the standard of medical care.

## 2019-12-12 DIAGNOSIS — I10 Essential (primary) hypertension: Secondary | ICD-10-CM | POA: Diagnosis not present

## 2019-12-17 ENCOUNTER — Ambulatory Visit: Payer: Self-pay | Admitting: Rheumatology

## 2019-12-25 DIAGNOSIS — M21611 Bunion of right foot: Secondary | ICD-10-CM | POA: Diagnosis not present

## 2019-12-25 DIAGNOSIS — M21612 Bunion of left foot: Secondary | ICD-10-CM | POA: Diagnosis not present

## 2019-12-25 DIAGNOSIS — M898X7 Other specified disorders of bone, ankle and foot: Secondary | ICD-10-CM | POA: Diagnosis not present

## 2019-12-25 DIAGNOSIS — M792 Neuralgia and neuritis, unspecified: Secondary | ICD-10-CM | POA: Diagnosis not present

## 2020-01-25 DIAGNOSIS — M2042 Other hammer toe(s) (acquired), left foot: Secondary | ICD-10-CM | POA: Diagnosis not present

## 2020-01-25 DIAGNOSIS — M21611 Bunion of right foot: Secondary | ICD-10-CM | POA: Diagnosis not present

## 2020-01-25 DIAGNOSIS — R2689 Other abnormalities of gait and mobility: Secondary | ICD-10-CM | POA: Diagnosis not present

## 2020-01-25 DIAGNOSIS — M21612 Bunion of left foot: Secondary | ICD-10-CM | POA: Diagnosis not present

## 2020-01-28 DIAGNOSIS — G8929 Other chronic pain: Secondary | ICD-10-CM | POA: Diagnosis not present

## 2020-01-28 DIAGNOSIS — Z01818 Encounter for other preprocedural examination: Secondary | ICD-10-CM | POA: Diagnosis not present

## 2020-02-01 DIAGNOSIS — M898X7 Other specified disorders of bone, ankle and foot: Secondary | ICD-10-CM | POA: Diagnosis not present

## 2020-02-01 DIAGNOSIS — I10 Essential (primary) hypertension: Secondary | ICD-10-CM | POA: Diagnosis not present

## 2020-02-01 DIAGNOSIS — M85671 Other cyst of bone, right ankle and foot: Secondary | ICD-10-CM | POA: Diagnosis not present

## 2020-02-01 DIAGNOSIS — M67472 Ganglion, left ankle and foot: Secondary | ICD-10-CM | POA: Diagnosis not present

## 2020-02-06 DIAGNOSIS — M898X7 Other specified disorders of bone, ankle and foot: Secondary | ICD-10-CM | POA: Diagnosis not present

## 2020-02-28 DIAGNOSIS — M79671 Pain in right foot: Secondary | ICD-10-CM | POA: Diagnosis not present

## 2020-02-28 DIAGNOSIS — M792 Neuralgia and neuritis, unspecified: Secondary | ICD-10-CM | POA: Diagnosis not present

## 2020-02-28 DIAGNOSIS — M7671 Peroneal tendinitis, right leg: Secondary | ICD-10-CM | POA: Diagnosis not present

## 2020-02-28 DIAGNOSIS — M7752 Other enthesopathy of left foot: Secondary | ICD-10-CM | POA: Diagnosis not present

## 2020-03-18 DIAGNOSIS — I10 Essential (primary) hypertension: Secondary | ICD-10-CM | POA: Diagnosis not present

## 2020-03-18 DIAGNOSIS — F419 Anxiety disorder, unspecified: Secondary | ICD-10-CM | POA: Diagnosis not present

## 2020-05-01 ENCOUNTER — Other Ambulatory Visit: Payer: Self-pay | Admitting: Internal Medicine

## 2020-05-01 DIAGNOSIS — Z1231 Encounter for screening mammogram for malignant neoplasm of breast: Secondary | ICD-10-CM

## 2020-05-07 ENCOUNTER — Other Ambulatory Visit: Payer: Self-pay

## 2020-05-07 ENCOUNTER — Ambulatory Visit
Admission: RE | Admit: 2020-05-07 | Discharge: 2020-05-07 | Disposition: A | Discharge status: Home / Self Care | Payer: BC Managed Care – PPO | Source: Ambulatory Visit

## 2020-05-07 DIAGNOSIS — Z1231 Encounter for screening mammogram for malignant neoplasm of breast: Secondary | ICD-10-CM

## 2020-05-09 ENCOUNTER — Other Ambulatory Visit: Payer: Self-pay | Admitting: Internal Medicine

## 2020-05-09 DIAGNOSIS — R928 Other abnormal and inconclusive findings on diagnostic imaging of breast: Secondary | ICD-10-CM

## 2020-05-22 ENCOUNTER — Ambulatory Visit: Payer: BC Managed Care – PPO

## 2020-05-22 ENCOUNTER — Ambulatory Visit
Admission: RE | Admit: 2020-05-22 | Discharge: 2020-05-22 | Disposition: A | Discharge status: Home / Self Care | Payer: BC Managed Care – PPO | Source: Ambulatory Visit | Attending: Internal Medicine | Admitting: Internal Medicine

## 2020-05-22 ENCOUNTER — Other Ambulatory Visit: Payer: Self-pay

## 2020-05-22 DIAGNOSIS — R928 Other abnormal and inconclusive findings on diagnostic imaging of breast: Secondary | ICD-10-CM

## 2020-05-22 DIAGNOSIS — R921 Mammographic calcification found on diagnostic imaging of breast: Secondary | ICD-10-CM | POA: Diagnosis not present

## 2020-05-22 DIAGNOSIS — N6489 Other specified disorders of breast: Secondary | ICD-10-CM | POA: Diagnosis not present

## 2020-05-29 DIAGNOSIS — M24875 Other specific joint derangements left foot, not elsewhere classified: Secondary | ICD-10-CM | POA: Diagnosis not present

## 2020-05-29 DIAGNOSIS — M21962 Unspecified acquired deformity of left lower leg: Secondary | ICD-10-CM | POA: Diagnosis not present

## 2020-05-29 DIAGNOSIS — M205X2 Other deformities of toe(s) (acquired), left foot: Secondary | ICD-10-CM | POA: Diagnosis not present

## 2020-05-29 DIAGNOSIS — M898X7 Other specified disorders of bone, ankle and foot: Secondary | ICD-10-CM | POA: Diagnosis not present

## 2020-06-03 DIAGNOSIS — Z0000 Encounter for general adult medical examination without abnormal findings: Secondary | ICD-10-CM | POA: Diagnosis not present

## 2020-06-03 DIAGNOSIS — I10 Essential (primary) hypertension: Secondary | ICD-10-CM | POA: Diagnosis not present

## 2020-06-03 DIAGNOSIS — E785 Hyperlipidemia, unspecified: Secondary | ICD-10-CM | POA: Diagnosis not present

## 2020-06-03 DIAGNOSIS — E559 Vitamin D deficiency, unspecified: Secondary | ICD-10-CM | POA: Diagnosis not present

## 2020-06-10 DIAGNOSIS — Z0000 Encounter for general adult medical examination without abnormal findings: Secondary | ICD-10-CM | POA: Diagnosis not present

## 2020-06-10 DIAGNOSIS — D649 Anemia, unspecified: Secondary | ICD-10-CM | POA: Diagnosis not present

## 2020-06-10 DIAGNOSIS — I10 Essential (primary) hypertension: Secondary | ICD-10-CM | POA: Diagnosis not present

## 2020-06-10 DIAGNOSIS — R82998 Other abnormal findings in urine: Secondary | ICD-10-CM | POA: Diagnosis not present

## 2020-06-24 ENCOUNTER — Other Ambulatory Visit: Payer: Self-pay | Admitting: Internal Medicine

## 2020-06-24 DIAGNOSIS — Z0000 Encounter for general adult medical examination without abnormal findings: Secondary | ICD-10-CM

## 2020-06-24 DIAGNOSIS — E785 Hyperlipidemia, unspecified: Secondary | ICD-10-CM

## 2020-06-25 DIAGNOSIS — M898X7 Other specified disorders of bone, ankle and foot: Secondary | ICD-10-CM | POA: Diagnosis not present

## 2020-06-25 DIAGNOSIS — M205X2 Other deformities of toe(s) (acquired), left foot: Secondary | ICD-10-CM | POA: Diagnosis not present

## 2020-06-25 DIAGNOSIS — M21962 Unspecified acquired deformity of left lower leg: Secondary | ICD-10-CM | POA: Diagnosis not present

## 2020-06-25 DIAGNOSIS — M24875 Other specific joint derangements left foot, not elsewhere classified: Secondary | ICD-10-CM | POA: Diagnosis not present

## 2020-07-01 DIAGNOSIS — M79672 Pain in left foot: Secondary | ICD-10-CM | POA: Diagnosis not present

## 2020-07-01 DIAGNOSIS — Z0283 Encounter for blood-alcohol and blood-drug test: Secondary | ICD-10-CM | POA: Diagnosis not present

## 2020-07-01 DIAGNOSIS — Z01812 Encounter for preprocedural laboratory examination: Secondary | ICD-10-CM | POA: Diagnosis not present

## 2020-07-01 DIAGNOSIS — Z0000 Encounter for general adult medical examination without abnormal findings: Secondary | ICD-10-CM | POA: Diagnosis not present

## 2020-07-04 DIAGNOSIS — M2042 Other hammer toe(s) (acquired), left foot: Secondary | ICD-10-CM | POA: Diagnosis not present

## 2020-07-04 DIAGNOSIS — M25572 Pain in left ankle and joints of left foot: Secondary | ICD-10-CM | POA: Diagnosis not present

## 2020-07-04 DIAGNOSIS — M216X2 Other acquired deformities of left foot: Secondary | ICD-10-CM | POA: Diagnosis not present

## 2020-07-04 DIAGNOSIS — M24875 Other specific joint derangements left foot, not elsewhere classified: Secondary | ICD-10-CM | POA: Diagnosis not present

## 2020-07-04 DIAGNOSIS — D1632 Benign neoplasm of short bones of left lower limb: Secondary | ICD-10-CM | POA: Diagnosis not present

## 2020-07-04 DIAGNOSIS — M2012 Hallux valgus (acquired), left foot: Secondary | ICD-10-CM | POA: Diagnosis not present

## 2020-07-04 DIAGNOSIS — M898X7 Other specified disorders of bone, ankle and foot: Secondary | ICD-10-CM | POA: Diagnosis not present

## 2020-07-04 DIAGNOSIS — M21962 Unspecified acquired deformity of left lower leg: Secondary | ICD-10-CM | POA: Diagnosis not present

## 2020-07-04 DIAGNOSIS — M205X2 Other deformities of toe(s) (acquired), left foot: Secondary | ICD-10-CM | POA: Diagnosis not present

## 2020-07-09 DIAGNOSIS — M21612 Bunion of left foot: Secondary | ICD-10-CM | POA: Diagnosis not present

## 2020-07-15 DIAGNOSIS — S90122A Contusion of left lesser toe(s) without damage to nail, initial encounter: Secondary | ICD-10-CM | POA: Diagnosis not present

## 2020-07-30 DIAGNOSIS — M24875 Other specific joint derangements left foot, not elsewhere classified: Secondary | ICD-10-CM | POA: Diagnosis not present

## 2020-08-11 ENCOUNTER — Ambulatory Visit
Admission: RE | Admit: 2020-08-11 | Discharge: 2020-08-11 | Disposition: A | Discharge status: Home / Self Care | Payer: BC Managed Care – PPO | Source: Ambulatory Visit | Attending: Internal Medicine | Admitting: Internal Medicine

## 2020-08-11 DIAGNOSIS — E785 Hyperlipidemia, unspecified: Secondary | ICD-10-CM

## 2020-08-11 DIAGNOSIS — E7800 Pure hypercholesterolemia, unspecified: Secondary | ICD-10-CM | POA: Diagnosis not present

## 2020-08-11 DIAGNOSIS — Z0000 Encounter for general adult medical examination without abnormal findings: Secondary | ICD-10-CM

## 2020-08-14 DIAGNOSIS — M24875 Other specific joint derangements left foot, not elsewhere classified: Secondary | ICD-10-CM | POA: Diagnosis not present

## 2020-09-11 DIAGNOSIS — Z1212 Encounter for screening for malignant neoplasm of rectum: Secondary | ICD-10-CM | POA: Diagnosis not present

## 2020-09-17 DIAGNOSIS — M24875 Other specific joint derangements left foot, not elsewhere classified: Secondary | ICD-10-CM | POA: Diagnosis not present

## 2020-10-14 ENCOUNTER — Ambulatory Visit: Payer: 59

## 2020-10-15 DIAGNOSIS — Z03818 Encounter for observation for suspected exposure to other biological agents ruled out: Secondary | ICD-10-CM | POA: Diagnosis not present

## 2020-10-22 IMAGING — MG DIGITAL SCREENING BILAT W/ TOMO W/ CAD
6 of 10 series · 6 of 30 positions shown · non-contrast
Comparison: Previous exam(s).

CLINICAL DATA: Screening.

EXAM:
DIGITAL SCREENING BILATERAL MAMMOGRAM WITH TOMO AND CAD

[L MLO synth-2D]
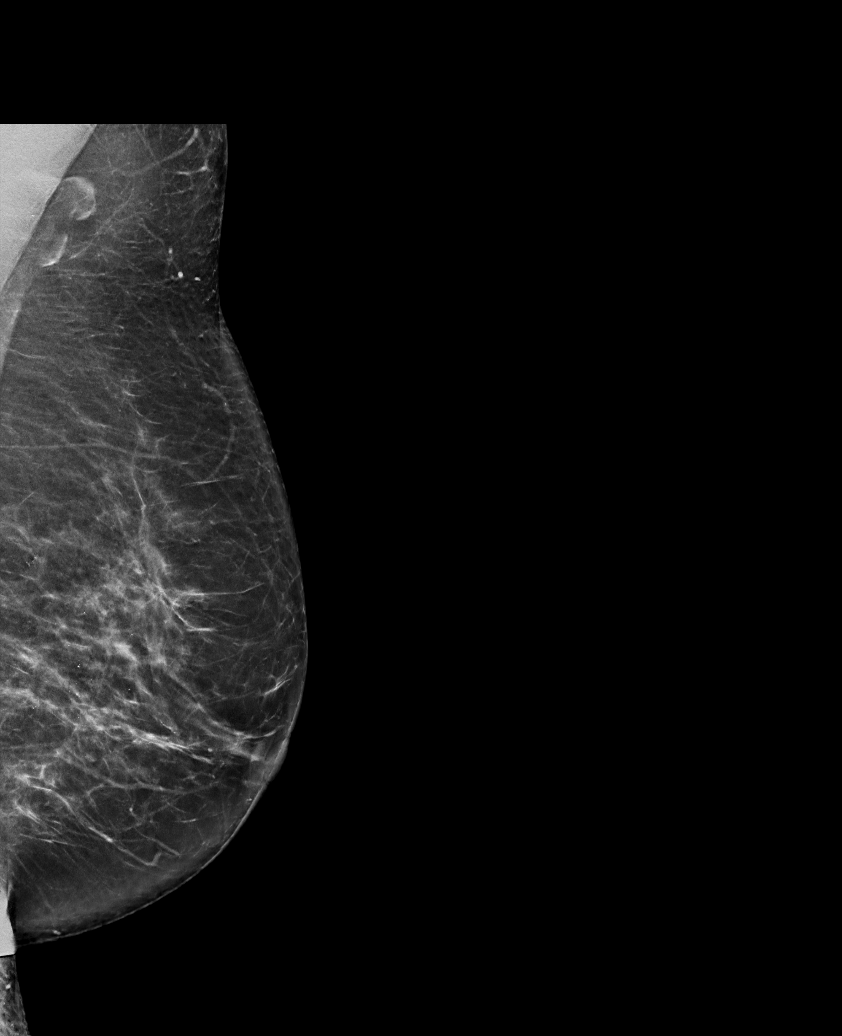

[R MLO synth-2D (1 of 2)]
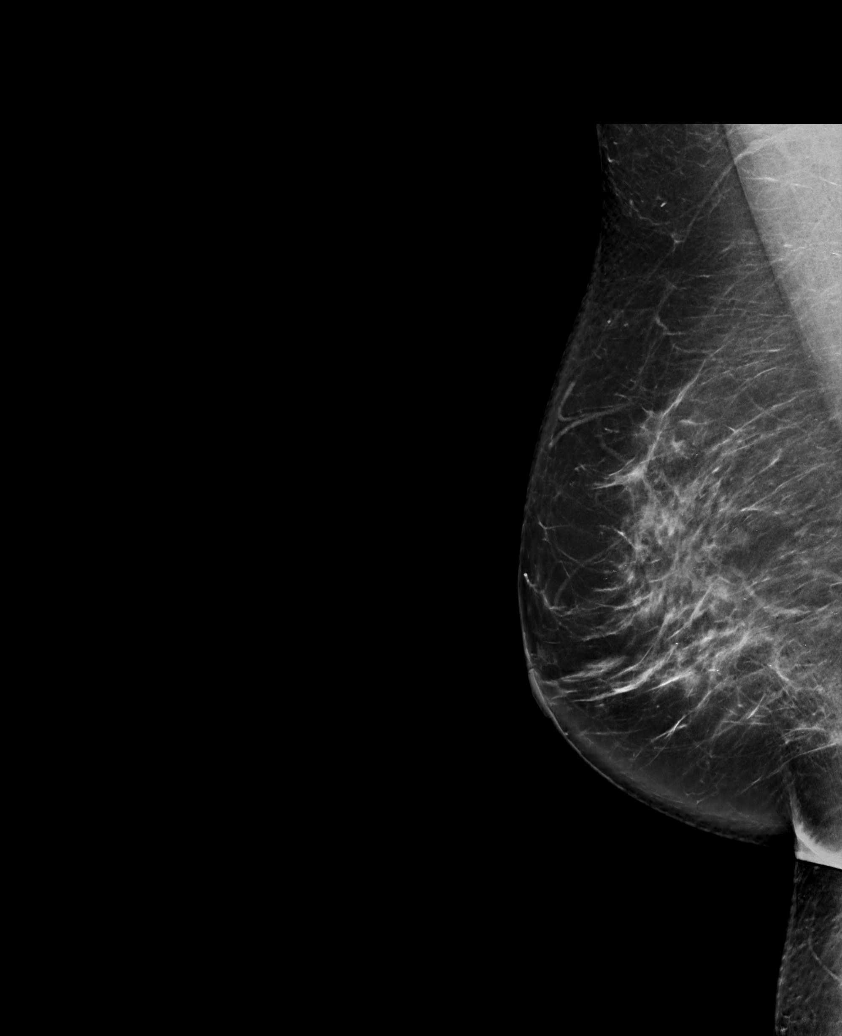

[R CC synth-2D]
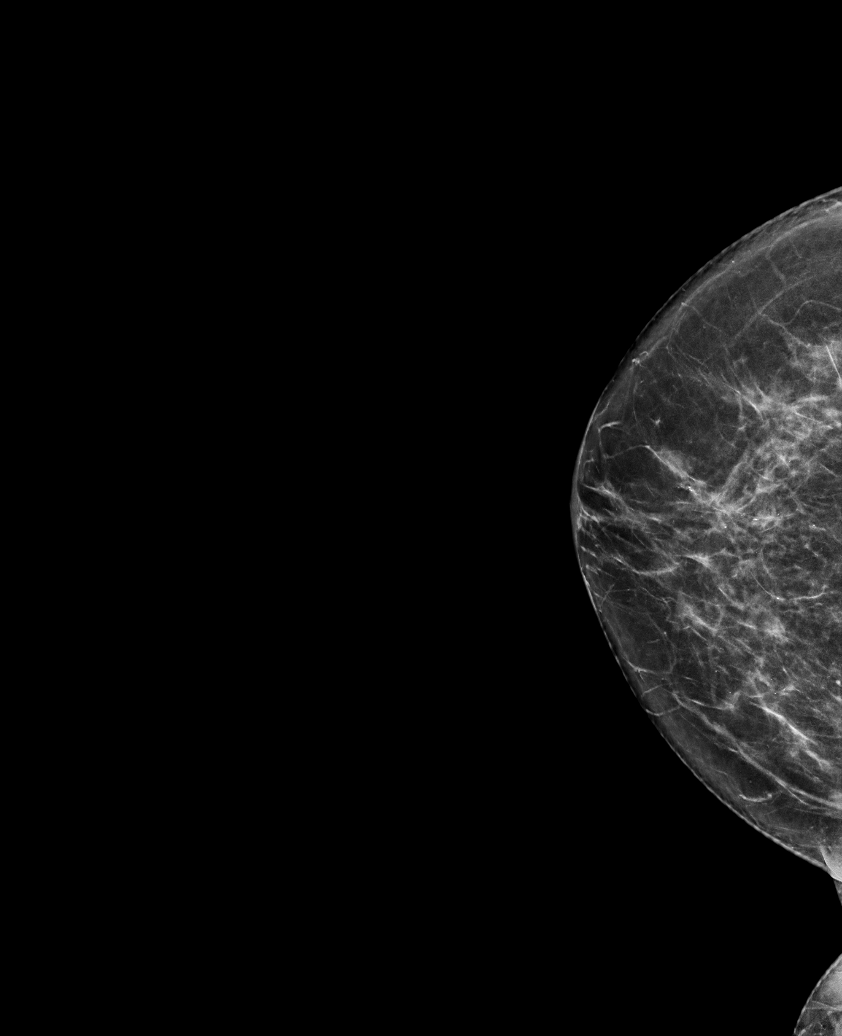

[R MLO synth-2D (2 of 2)]
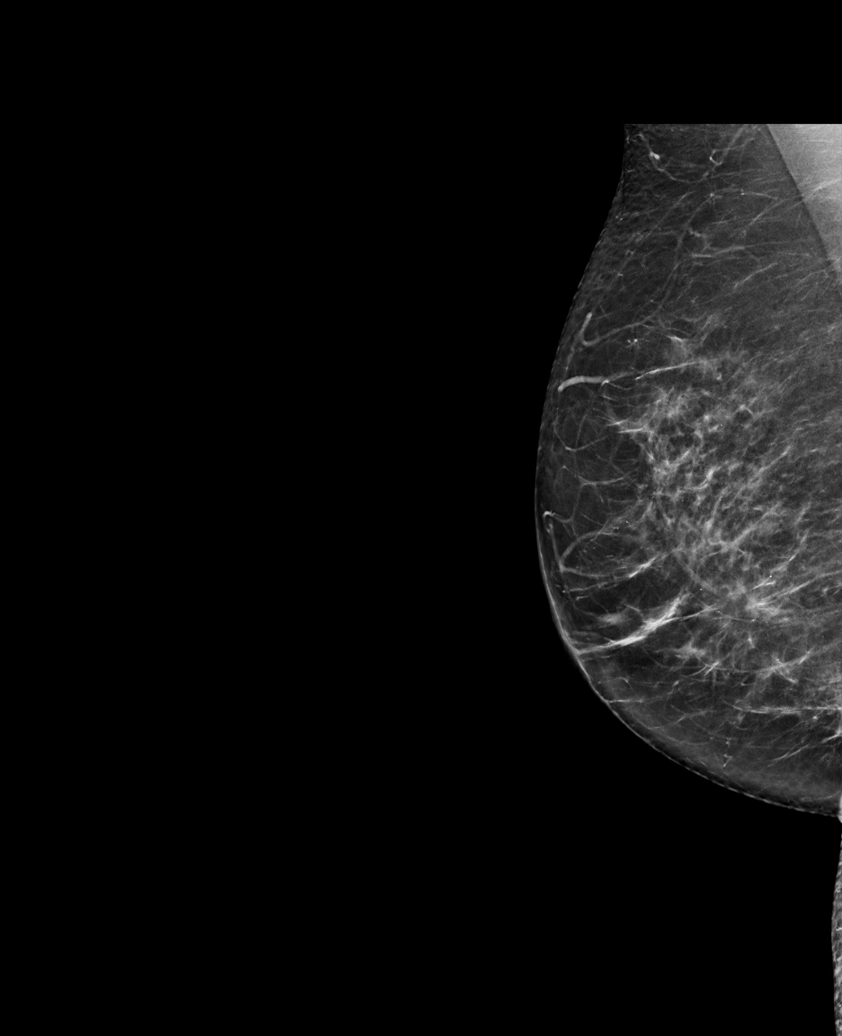

[L CC synth-2D]
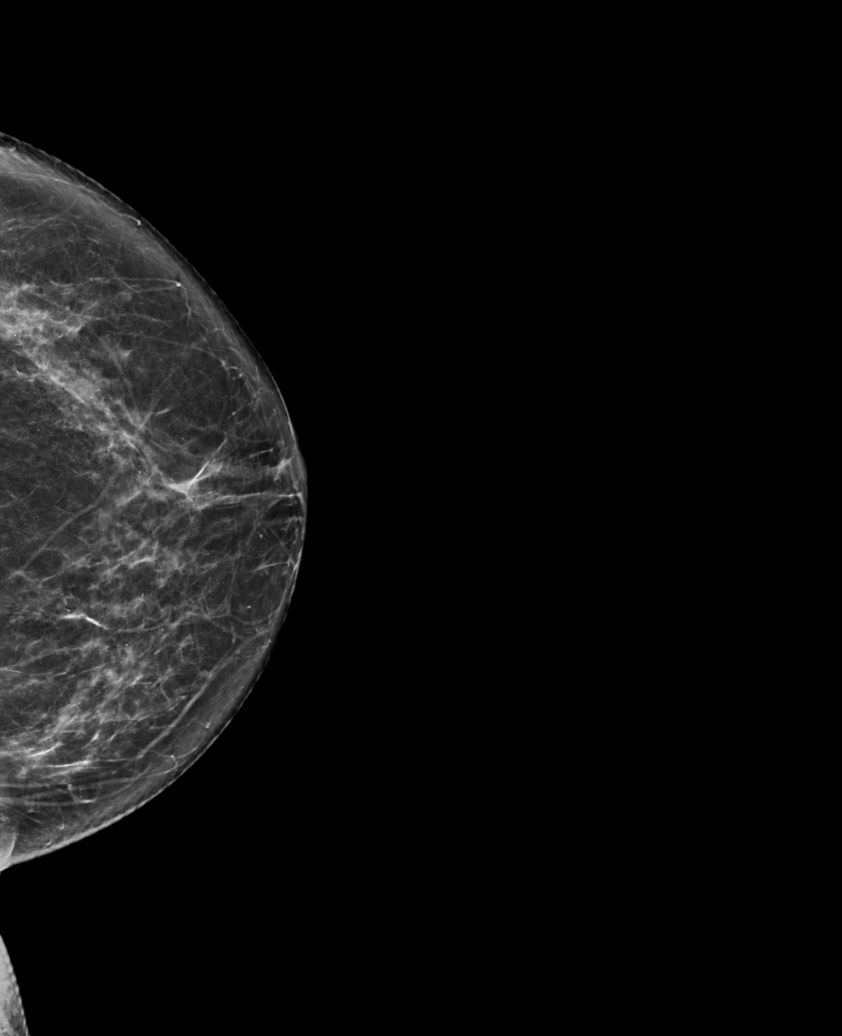

[L MLO tomo · tomo slice 40/79.0]
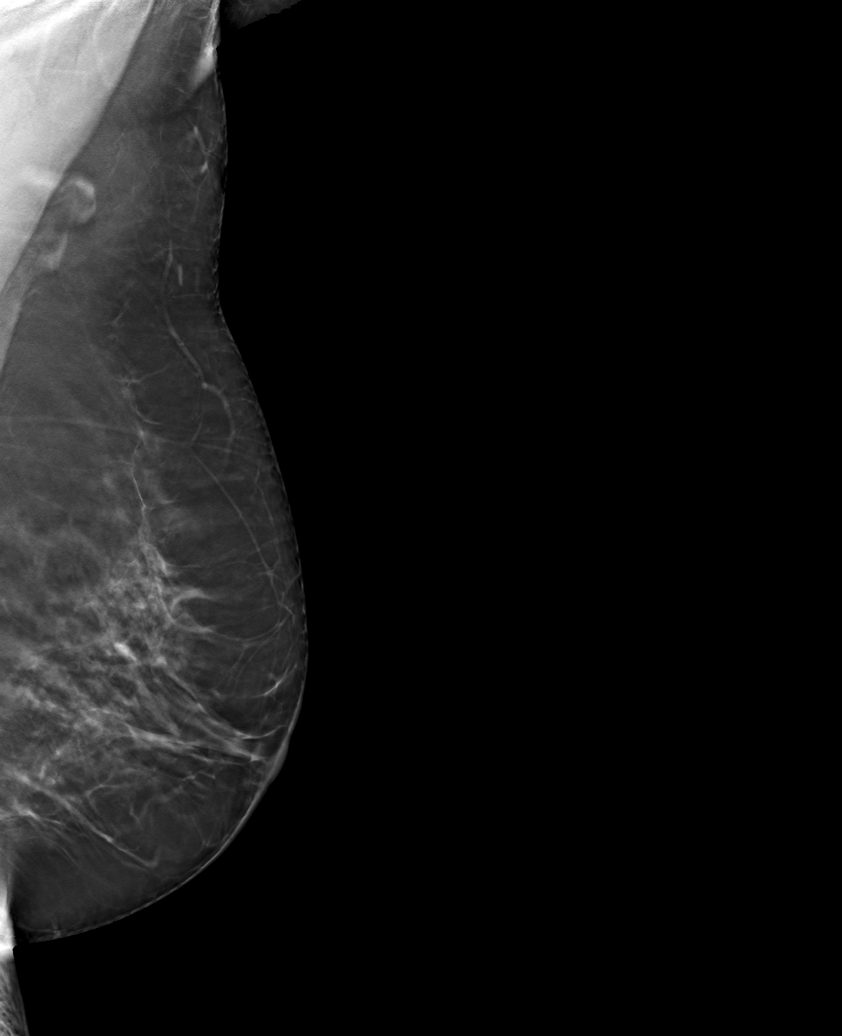

[6 of 30 positions shown; findings below may reference images not displayed]

ACR Breast Density Category c: The breast tissue is heterogeneously
dense, which may obscure small masses.
FINDINGS: In the right breast calcifications and an asymmetry require further
evaluation.

In the left breast and asymmetry requires further evaluation.

Images were processed with CAD.
IMPRESSION: Further evaluation is suggested for possible calcifications and an
asymmetry in the right breast.

Further evaluation is suggested for possible an asymmetry in the
left breast.

RECOMMENDATION:
Diagnostic mammogram and possibly ultrasound of both breasts.
(Code:[id number])

The patient will be contacted regarding the findings, and additional
imaging will be scheduled.

BI-RADS CATEGORY  0: Incomplete. Need additional imaging evaluation
and/or prior mammograms for comparison.

## 2020-10-29 DIAGNOSIS — M898X7 Other specified disorders of bone, ankle and foot: Secondary | ICD-10-CM | POA: Diagnosis not present

## 2020-10-29 DIAGNOSIS — B351 Tinea unguium: Secondary | ICD-10-CM | POA: Diagnosis not present

## 2020-10-29 DIAGNOSIS — M24875 Other specific joint derangements left foot, not elsewhere classified: Secondary | ICD-10-CM | POA: Diagnosis not present

## 2020-10-29 DIAGNOSIS — M7752 Other enthesopathy of left foot: Secondary | ICD-10-CM | POA: Diagnosis not present

## 2020-10-29 DIAGNOSIS — M2042 Other hammer toe(s) (acquired), left foot: Secondary | ICD-10-CM | POA: Diagnosis not present

## 2020-10-29 DIAGNOSIS — M2012 Hallux valgus (acquired), left foot: Secondary | ICD-10-CM | POA: Diagnosis not present

## 2020-11-18 DIAGNOSIS — I10 Essential (primary) hypertension: Secondary | ICD-10-CM | POA: Diagnosis not present

## 2020-11-18 DIAGNOSIS — R059 Cough, unspecified: Secondary | ICD-10-CM | POA: Diagnosis not present

## 2020-11-19 DIAGNOSIS — B351 Tinea unguium: Secondary | ICD-10-CM | POA: Diagnosis not present

## 2020-11-19 DIAGNOSIS — M2042 Other hammer toe(s) (acquired), left foot: Secondary | ICD-10-CM | POA: Diagnosis not present

## 2020-11-19 DIAGNOSIS — M2012 Hallux valgus (acquired), left foot: Secondary | ICD-10-CM | POA: Diagnosis not present

## 2020-11-19 DIAGNOSIS — M7751 Other enthesopathy of right foot: Secondary | ICD-10-CM | POA: Diagnosis not present

## 2020-11-19 DIAGNOSIS — M24875 Other specific joint derangements left foot, not elsewhere classified: Secondary | ICD-10-CM | POA: Diagnosis not present

## 2020-12-03 DIAGNOSIS — R059 Cough, unspecified: Secondary | ICD-10-CM | POA: Diagnosis not present

## 2020-12-03 DIAGNOSIS — I10 Essential (primary) hypertension: Secondary | ICD-10-CM | POA: Diagnosis not present

## 2020-12-04 DIAGNOSIS — R062 Wheezing: Secondary | ICD-10-CM | POA: Diagnosis not present

## 2021-02-16 DIAGNOSIS — M19071 Primary osteoarthritis, right ankle and foot: Secondary | ICD-10-CM | POA: Diagnosis not present

## 2021-02-16 DIAGNOSIS — M24571 Contracture, right ankle: Secondary | ICD-10-CM | POA: Diagnosis not present

## 2021-02-16 DIAGNOSIS — M898X7 Other specified disorders of bone, ankle and foot: Secondary | ICD-10-CM | POA: Diagnosis not present

## 2021-02-16 DIAGNOSIS — M19072 Primary osteoarthritis, left ankle and foot: Secondary | ICD-10-CM | POA: Diagnosis not present

## 2021-02-16 DIAGNOSIS — M65872 Other synovitis and tenosynovitis, left ankle and foot: Secondary | ICD-10-CM | POA: Diagnosis not present

## 2021-02-16 DIAGNOSIS — M24572 Contracture, left ankle: Secondary | ICD-10-CM | POA: Diagnosis not present

## 2021-02-17 DIAGNOSIS — M7752 Other enthesopathy of left foot: Secondary | ICD-10-CM | POA: Diagnosis not present

## 2021-02-17 DIAGNOSIS — M7751 Other enthesopathy of right foot: Secondary | ICD-10-CM | POA: Diagnosis not present

## 2021-03-24 DIAGNOSIS — M2012 Hallux valgus (acquired), left foot: Secondary | ICD-10-CM | POA: Diagnosis not present

## 2021-03-24 DIAGNOSIS — M24571 Contracture, right ankle: Secondary | ICD-10-CM | POA: Diagnosis not present

## 2021-04-08 DIAGNOSIS — Z20828 Contact with and (suspected) exposure to other viral communicable diseases: Secondary | ICD-10-CM | POA: Diagnosis not present

## 2021-04-13 DIAGNOSIS — Z03818 Encounter for observation for suspected exposure to other biological agents ruled out: Secondary | ICD-10-CM | POA: Diagnosis not present

## 2021-06-10 DIAGNOSIS — E559 Vitamin D deficiency, unspecified: Secondary | ICD-10-CM | POA: Diagnosis not present

## 2021-06-10 DIAGNOSIS — E785 Hyperlipidemia, unspecified: Secondary | ICD-10-CM | POA: Diagnosis not present

## 2021-06-10 DIAGNOSIS — Z780 Asymptomatic menopausal state: Secondary | ICD-10-CM | POA: Diagnosis not present

## 2021-06-17 DIAGNOSIS — Z1331 Encounter for screening for depression: Secondary | ICD-10-CM | POA: Diagnosis not present

## 2021-06-17 DIAGNOSIS — D649 Anemia, unspecified: Secondary | ICD-10-CM | POA: Diagnosis not present

## 2021-06-17 DIAGNOSIS — Z0000 Encounter for general adult medical examination without abnormal findings: Secondary | ICD-10-CM | POA: Diagnosis not present

## 2021-06-17 DIAGNOSIS — Z1339 Encounter for screening examination for other mental health and behavioral disorders: Secondary | ICD-10-CM | POA: Diagnosis not present

## 2021-06-17 DIAGNOSIS — I10 Essential (primary) hypertension: Secondary | ICD-10-CM | POA: Diagnosis not present

## 2021-07-03 ENCOUNTER — Other Ambulatory Visit: Payer: Self-pay | Admitting: Internal Medicine

## 2021-07-03 DIAGNOSIS — Z1231 Encounter for screening mammogram for malignant neoplasm of breast: Secondary | ICD-10-CM

## 2021-07-13 ENCOUNTER — Encounter (HOSPITAL_BASED_OUTPATIENT_CLINIC_OR_DEPARTMENT_OTHER): Payer: Self-pay | Admitting: Obstetrics & Gynecology

## 2021-07-13 ENCOUNTER — Ambulatory Visit (INDEPENDENT_AMBULATORY_CARE_PROVIDER_SITE_OTHER): Payer: BC Managed Care – PPO | Admitting: Obstetrics & Gynecology

## 2021-07-13 ENCOUNTER — Other Ambulatory Visit: Payer: Self-pay

## 2021-07-13 VITALS — BP 142/73 | HR 108 | Ht 64.75 in | Wt 203.0 lb

## 2021-07-13 DIAGNOSIS — N8111 Cystocele, midline: Secondary | ICD-10-CM

## 2021-07-13 DIAGNOSIS — M79672 Pain in left foot: Secondary | ICD-10-CM | POA: Diagnosis not present

## 2021-07-13 DIAGNOSIS — Z01419 Encounter for gynecological examination (general) (routine) without abnormal findings: Secondary | ICD-10-CM | POA: Diagnosis not present

## 2021-07-13 DIAGNOSIS — Z90710 Acquired absence of both cervix and uterus: Secondary | ICD-10-CM | POA: Diagnosis not present

## 2021-07-13 DIAGNOSIS — M19071 Primary osteoarthritis, right ankle and foot: Secondary | ICD-10-CM | POA: Diagnosis not present

## 2021-07-13 DIAGNOSIS — Z8619 Personal history of other infectious and parasitic diseases: Secondary | ICD-10-CM

## 2021-07-13 DIAGNOSIS — F419 Anxiety disorder, unspecified: Secondary | ICD-10-CM

## 2021-07-13 DIAGNOSIS — M19072 Primary osteoarthritis, left ankle and foot: Secondary | ICD-10-CM | POA: Diagnosis not present

## 2021-07-13 NOTE — Progress Notes (Signed)
60 y.o. [id number] [name] here for annual exam.  Having issues with her feet.  Has undergone two surgeries on her feet with a podiatrist.  She is seeing a new orthopedist.    Denies vaginal bleeding.    Having a lot of stress related to work.  Has xanax if she needs it.  Dr. Jackelyn Poling manages this for her.    PCP:  Dr. Ardeth Perfect.  Had appt and lab work two weeks ago.  Patient's last menstrual period was 03/06/2012 (exact date).          Sexually active: Yes.    The current method of family planning is status post hysterectomy.    Exercising: No.  Smoker:  no  Health Maintenance: Pap:  07/23/2019 Negative.  H/O HR HPV 2016 and 2017 after hysterectomy.  2019 and 2020 neg with neg HR HPV History of abnormal Pap:  yes MMG:  05/07/2020 Additional images needed Colonoscopy:  cologuard done 08/25/2019, negative BMD:   done with Dr. Jackelyn Poling Screening Labs: done with PCP   reports that she quit smoking about 32 years ago. Her smoking use included cigarettes. She has a 5.00 pack-year smoking history. She has never used smokeless tobacco. She reports current alcohol use. She reports that she does not use drugs.  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 30 tablet 0   buPROPion (WELLBUTRIN SR) 150 MG 12 hr tablet Take 150 mg by mouth 2 (two) times daily.   0   Vitamin D, Ergocalciferol, (DRISDOL) 50000 units CAPS capsule Take 1 capsule (50,000 Units total) by mouth every Monday. 12 capsule 4   diclofenac Sodium (VOLTAREN) 1 % GEL Apply topically as needed. (Patient not taking: Reported on 07/13/2021)     nabumetone (RELAFEN) 500 MG tablet Take 500 mg by mouth 2 (two) times daily as needed. (Patient not taking: Reported on 07/13/2021)     No current facility-administered medications for this visit.    Family History  Problem Relation Age of Onset   Osteoarthritis Mother    Scleroderma Mother    Raynaud syndrome Mother    Melanoma Mother            Arthritis Father    Diabetes Maternal Grandmother    Thyroid disease Maternal Grandmother    Cancer Maternal Grandfather        pancreatic cancer   Drug abuse Son    Healthy Son    Breast cancer Neg Hx     Review of Systems  All other systems reviewed and are negative.  Exam:   BP (!) 142/73 (  BP Location: Left Arm, Patient Position: Sitting, Cuff Size: Large)   Pulse (!) 108   Ht 5' 4.75" (1.645 m)   Wt 203 lb (92.1 kg)   LMP 03/06/2012 (Exact Date)   BMI 34.04 kg/m   Height: 5' 4.75" (164.5 cm)  General appearance: alert, cooperative and appears stated age Head: Normocephalic, without obvious abnormality, atraumatic Neck: no adenopathy, supple, symmetrical, trachea midline and thyroid normal to inspection and palpation Lungs: clear to auscultation bilaterally Breasts: normal appearance, no masses or tenderness Heart: regular rate and rhythm Abdomen: soft, non-tender; bowel sounds normal; no masses,  no organomegaly Extremities: extremities normal, atraumatic, no cyanosis or edema Skin: Skin color, texture, turgor normal. No rashes or lesions Lymph nodes: Cervical, supraclavicular, and axillary nodes normal. No abnormal inguinal nodes  palpated Neurologic: Grossly normal   Pelvic: External genitalia:  no lesions              Urethra:  normal appearing urethra with no masses, tenderness or lesions              Bartholins and Skenes: normal                 Vagina: normal appearing vagina with normal color and no discharge, no lesions              Cervix: absent              Pap taken: No. Bimanual Exam:  Uterus:  uterus absent              Adnexa: no mass, fullness, tenderness               Rectovaginal: Confirms               Anus:  normal sphincter tone, no lesions  Chaperone, Octaviano Batty, CMA, was present for exam.  Assessment/Plan: 1. Well woman exam with routine gynecological exam - plan pap smear next year - MMG scheduled 07/15/2021 - BMD done with Dr. Jackelyn Poling - Cologuard 2020 - care caps updated/reviewed  2. 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 H/O: hysterectomy -completed 2013  4.  Anxiety

## 2021-07-14 ENCOUNTER — Other Ambulatory Visit: Payer: Self-pay | Admitting: Orthopaedic Surgery

## 2021-07-14 DIAGNOSIS — M19072 Primary osteoarthritis, left ankle and foot: Secondary | ICD-10-CM

## 2021-07-14 DIAGNOSIS — M79672 Pain in left foot: Secondary | ICD-10-CM

## 2021-07-15 ENCOUNTER — Other Ambulatory Visit: Payer: Self-pay

## 2021-07-15 ENCOUNTER — Ambulatory Visit
Admission: RE | Admit: 2021-07-15 | Discharge: 2021-07-15 | Disposition: A | Discharge status: Home / Self Care | Payer: BC Managed Care – PPO | Source: Ambulatory Visit

## 2021-07-15 DIAGNOSIS — Z1231 Encounter for screening mammogram for malignant neoplasm of breast: Secondary | ICD-10-CM | POA: Diagnosis not present

## 2021-07-30 ENCOUNTER — Other Ambulatory Visit: Payer: BC Managed Care – PPO

## 2021-08-31 ENCOUNTER — Ambulatory Visit
Admission: RE | Admit: 2021-08-31 | Discharge: 2021-08-31 | Disposition: A | Discharge status: Home / Self Care | Payer: BC Managed Care – PPO | Source: Ambulatory Visit | Attending: Orthopaedic Surgery | Admitting: Orthopaedic Surgery

## 2021-08-31 ENCOUNTER — Other Ambulatory Visit: Payer: Self-pay

## 2021-08-31 DIAGNOSIS — M19072 Primary osteoarthritis, left ankle and foot: Secondary | ICD-10-CM | POA: Diagnosis not present

## 2021-08-31 DIAGNOSIS — Z981 Arthrodesis status: Secondary | ICD-10-CM | POA: Diagnosis not present

## 2021-08-31 DIAGNOSIS — M7732 Calcaneal spur, left foot: Secondary | ICD-10-CM | POA: Diagnosis not present

## 2021-08-31 DIAGNOSIS — M79672 Pain in left foot: Secondary | ICD-10-CM

## 2021-09-07 DIAGNOSIS — M79672 Pain in left foot: Secondary | ICD-10-CM | POA: Diagnosis not present

## 2021-09-15 DIAGNOSIS — J40 Bronchitis, not specified as acute or chronic: Secondary | ICD-10-CM | POA: Diagnosis not present

## 2021-09-15 DIAGNOSIS — R0789 Other chest pain: Secondary | ICD-10-CM | POA: Diagnosis not present

## 2021-09-15 DIAGNOSIS — R051 Acute cough: Secondary | ICD-10-CM | POA: Diagnosis not present

## 2021-12-22 DIAGNOSIS — R059 Cough, unspecified: Secondary | ICD-10-CM | POA: Diagnosis not present

## 2021-12-22 DIAGNOSIS — R0981 Nasal congestion: Secondary | ICD-10-CM | POA: Diagnosis not present

## 2021-12-22 DIAGNOSIS — J209 Acute bronchitis, unspecified: Secondary | ICD-10-CM | POA: Diagnosis not present

## 2022-02-15 DIAGNOSIS — M79672 Pain in left foot: Secondary | ICD-10-CM | POA: Diagnosis not present

## 2022-04-28 DIAGNOSIS — H10413 Chronic giant papillary conjunctivitis, bilateral: Secondary | ICD-10-CM | POA: Diagnosis not present

## 2022-04-28 DIAGNOSIS — H04221 Epiphora due to insufficient drainage, right lacrimal gland: Secondary | ICD-10-CM | POA: Diagnosis not present

## 2022-04-28 DIAGNOSIS — H5213 Myopia, bilateral: Secondary | ICD-10-CM | POA: Diagnosis not present

## 2022-04-28 DIAGNOSIS — H04123 Dry eye syndrome of bilateral lacrimal glands: Secondary | ICD-10-CM | POA: Diagnosis not present

## 2022-06-11 DIAGNOSIS — M79672 Pain in left foot: Secondary | ICD-10-CM | POA: Diagnosis not present

## 2022-06-11 DIAGNOSIS — M25552 Pain in left hip: Secondary | ICD-10-CM | POA: Diagnosis not present

## 2022-06-11 DIAGNOSIS — M79671 Pain in right foot: Secondary | ICD-10-CM | POA: Diagnosis not present

## 2022-06-11 DIAGNOSIS — M5451 Vertebrogenic low back pain: Secondary | ICD-10-CM | POA: Diagnosis not present

## 2022-06-14 ENCOUNTER — Other Ambulatory Visit: Payer: Self-pay | Admitting: Orthopaedic Surgery

## 2022-06-14 DIAGNOSIS — M79671 Pain in right foot: Secondary | ICD-10-CM

## 2022-06-16 DIAGNOSIS — I10 Essential (primary) hypertension: Secondary | ICD-10-CM | POA: Diagnosis not present

## 2022-06-16 DIAGNOSIS — E559 Vitamin D deficiency, unspecified: Secondary | ICD-10-CM | POA: Diagnosis not present

## 2022-06-16 DIAGNOSIS — F419 Anxiety disorder, unspecified: Secondary | ICD-10-CM | POA: Diagnosis not present

## 2022-06-23 DIAGNOSIS — Z1331 Encounter for screening for depression: Secondary | ICD-10-CM | POA: Diagnosis not present

## 2022-06-23 DIAGNOSIS — D649 Anemia, unspecified: Secondary | ICD-10-CM | POA: Diagnosis not present

## 2022-06-23 DIAGNOSIS — Z0000 Encounter for general adult medical examination without abnormal findings: Secondary | ICD-10-CM | POA: Diagnosis not present

## 2022-06-23 DIAGNOSIS — Z1339 Encounter for screening examination for other mental health and behavioral disorders: Secondary | ICD-10-CM | POA: Diagnosis not present

## 2022-06-23 DIAGNOSIS — I10 Essential (primary) hypertension: Secondary | ICD-10-CM | POA: Diagnosis not present

## 2022-07-01 ENCOUNTER — Other Ambulatory Visit: Payer: BC Managed Care – PPO

## 2022-07-02 ENCOUNTER — Ambulatory Visit
Admission: RE | Admit: 2022-07-02 | Discharge: 2022-07-02 | Disposition: A | Discharge status: Home / Self Care | Payer: BC Managed Care – PPO | Source: Ambulatory Visit | Attending: Orthopaedic Surgery | Admitting: Orthopaedic Surgery

## 2022-07-02 DIAGNOSIS — M79671 Pain in right foot: Secondary | ICD-10-CM

## 2022-07-02 DIAGNOSIS — M7731 Calcaneal spur, right foot: Secondary | ICD-10-CM | POA: Diagnosis not present

## 2022-07-02 DIAGNOSIS — M19071 Primary osteoarthritis, right ankle and foot: Secondary | ICD-10-CM | POA: Diagnosis not present

## 2022-07-09 DIAGNOSIS — M79671 Pain in right foot: Secondary | ICD-10-CM | POA: Diagnosis not present

## 2022-07-09 DIAGNOSIS — M19072 Primary osteoarthritis, left ankle and foot: Secondary | ICD-10-CM | POA: Diagnosis not present

## 2022-07-09 DIAGNOSIS — M79672 Pain in left foot: Secondary | ICD-10-CM | POA: Diagnosis not present

## 2022-07-09 DIAGNOSIS — M25552 Pain in left hip: Secondary | ICD-10-CM | POA: Diagnosis not present

## 2022-07-09 DIAGNOSIS — M19071 Primary osteoarthritis, right ankle and foot: Secondary | ICD-10-CM | POA: Diagnosis not present

## 2022-07-13 ENCOUNTER — Encounter (HOSPITAL_BASED_OUTPATIENT_CLINIC_OR_DEPARTMENT_OTHER): Payer: Self-pay | Admitting: Obstetrics & Gynecology

## 2022-07-13 ENCOUNTER — Other Ambulatory Visit (HOSPITAL_COMMUNITY)
Admission: RE | Admit: 2022-07-13 | Discharge: 2022-07-13 | Disposition: A | Discharge status: Home / Self Care | Payer: BC Managed Care – PPO | Source: Ambulatory Visit | Attending: Obstetrics & Gynecology | Admitting: Obstetrics & Gynecology

## 2022-07-13 ENCOUNTER — Ambulatory Visit (INDEPENDENT_AMBULATORY_CARE_PROVIDER_SITE_OTHER): Payer: BC Managed Care – PPO | Admitting: Obstetrics & Gynecology

## 2022-07-13 VITALS — BP 130/75 | HR 71 | Ht 65.0 in | Wt 192.2 lb

## 2022-07-13 DIAGNOSIS — Z90710 Acquired absence of both cervix and uterus: Secondary | ICD-10-CM | POA: Diagnosis not present

## 2022-07-13 DIAGNOSIS — Z124 Encounter for screening for malignant neoplasm of cervix: Secondary | ICD-10-CM | POA: Insufficient documentation

## 2022-07-13 DIAGNOSIS — Z1211 Encounter for screening for malignant neoplasm of colon: Secondary | ICD-10-CM

## 2022-07-13 DIAGNOSIS — Z8619 Personal history of other infectious and parasitic diseases: Secondary | ICD-10-CM | POA: Diagnosis not present

## 2022-07-13 DIAGNOSIS — Z01419 Encounter for gynecological examination (general) (routine) without abnormal findings: Secondary | ICD-10-CM

## 2022-07-13 NOTE — Progress Notes (Unsigned)
61 y.o. [id number] [name] here for annual exam.  Denies vaginal bleeding.    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    Continuing to have feet issues.  Saw different ortho this past year.  Will plan to have some hardware in left foot removed.  Having monthly injections.  Wishes she didn't have to do this but keeps her being able to walk.  Patient's last menstrual period was 03/06/2012 (exact date).          Sexually active: Yes.    The current method of family planning is status post hysterectomy.    Exercising: No.  Smoker:  no  Health Maintenance: Pap:  07/23/2019 Negative History of abnormal Pap:  yes MMG:  07/15/2021 Negative Colonoscopy:  cologuard done 08/25/2019 BMD:   done with Dr. Jackelyn Poling Screening Labs: done with Dr. Jackelyn Poling last month.  Reports her Vit D is trending downward.     reports that she quit smoking about 33 years ago. Her smoking use included cigarettes. She has a 5.00 pack-year smoking history. She has never used smokeless tobacco. She reports current alcohol use. She reports that she does not use drugs.  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 30 tablet 0   buPROPion  (WELLBUTRIN SR) 150 MG 12 hr tablet Take 150 mg by mouth 2 (two) times daily.   0   olmesartan (BENICAR) 20 MG tablet Take 20 mg by mouth daily.     Vitamin D, Ergocalciferol, (DRISDOL) 50000 units CAPS capsule Take 1 capsule (50,000 Units total) by mouth every Monday. 12 capsule 4   No current facility-administered medications for this visit.    Family History  Problem Relation Age of Onset   Osteoarthritis Mother    Scleroderma Mother    Raynaud syndrome Mother    Melanoma Mother            Arthritis Father    Diabetes Maternal Grandmother    Thyroid disease Maternal Grandmother    Cancer Maternal Grandfather        pancreatic cancer   Drug abuse Son    Healthy Son    Breast cancer Neg Hx     ROS: Constitutional: negative Genitourinary:negative  Exam:   BP 130/75 (BP Location: Left Arm, Patient Position: Sitting, Cuff Size: Large)   Pulse 71   Ht '5\' 5"'$  (1.651 m) Comment: Reported  Wt 192 lb 3.2 oz (87.2 kg)   LMP 03/06/2012 (Exact Date)   BMI 31.98 kg/m   Height: '5\' 5"'$  (165.1 cm) (Reported)  General appearance: alert, cooperative and appears  stated age Head: Normocephalic, without obvious abnormality, atraumatic Neck: no adenopathy, supple, symmetrical, trachea midline and thyroid normal to inspection and palpation Lungs: clear to auscultation bilaterally Breasts: normal appearance, no masses or tenderness Heart: regular rate and rhythm Abdomen: soft, non-tender; bowel sounds normal; no masses,  no organomegaly Extremities: extremities normal, atraumatic, no cyanosis or edema Skin: Skin color, texture, turgor normal. No rashes or lesions Lymph nodes: Cervical, supraclavicular, and axillary nodes normal. No abnormal inguinal nodes palpated Neurologic: Grossly normal   Pelvic: External genitalia:  no lesions              Urethra:  normal appearing urethra with no masses, tenderness or lesions              Bartholins and Skenes: normal                 Vagina:  atrophic vagina with normal color and no discharge, no lesions              Cervix: surgically absent              Pap taken: yes Bimanual Exam:  Uterus:  no masses              Adnexa: normal adnexa and no mass, fullness, tenderness               Rectovaginal: Confirms               Anus:  normal sphincter tone, no lesions  Chaperone, Octaviano Batty, CMA, was present for exam.  Assessment/Plan: 1. Well woman exam with routine gynecological exam - Pap smear and HR HPV obtained - Mammogram 07/15/2021 - Colonoscopy offered and declined.  Pt willing to do cologuard.  Order placed today.  - Bone mineral density done with Dr. Jackelyn Poling - lab work done done with PCP - vaccines reviewed/updated  2. Cervical cancer screening - Cytology - PAP( [facility])  3. History of HPV infection - pap smear obtained today  4. H/O: hysterectomy

## 2022-07-15 LAB — CYTOLOGY - PAP
Comment: NEGATIVE
Diagnosis: NEGATIVE
High risk HPV: NEGATIVE

## 2022-07-21 ENCOUNTER — Ambulatory Visit (HOSPITAL_BASED_OUTPATIENT_CLINIC_OR_DEPARTMENT_OTHER): Payer: BC Managed Care – PPO | Admitting: Obstetrics & Gynecology

## 2022-07-21 DIAGNOSIS — M6281 Muscle weakness (generalized): Secondary | ICD-10-CM | POA: Diagnosis not present

## 2022-07-21 DIAGNOSIS — S76312D Strain of muscle, fascia and tendon of the posterior muscle group at thigh level, left thigh, subsequent encounter: Secondary | ICD-10-CM | POA: Diagnosis not present

## 2022-09-23 DIAGNOSIS — Z1211 Encounter for screening for malignant neoplasm of colon: Secondary | ICD-10-CM | POA: Diagnosis not present

## 2022-10-01 LAB — COLOGUARD: COLOGUARD: NEGATIVE

## 2022-11-26 DIAGNOSIS — M545 Low back pain, unspecified: Secondary | ICD-10-CM | POA: Diagnosis not present

## 2022-11-26 DIAGNOSIS — M7061 Trochanteric bursitis, right hip: Secondary | ICD-10-CM | POA: Diagnosis not present

## 2022-11-26 DIAGNOSIS — M7062 Trochanteric bursitis, left hip: Secondary | ICD-10-CM | POA: Diagnosis not present

## 2022-12-27 DIAGNOSIS — M19071 Primary osteoarthritis, right ankle and foot: Secondary | ICD-10-CM | POA: Diagnosis not present

## 2022-12-27 DIAGNOSIS — M19072 Primary osteoarthritis, left ankle and foot: Secondary | ICD-10-CM | POA: Diagnosis not present

## 2023-02-08 DIAGNOSIS — M19072 Primary osteoarthritis, left ankle and foot: Secondary | ICD-10-CM | POA: Diagnosis not present

## 2023-02-08 DIAGNOSIS — M19071 Primary osteoarthritis, right ankle and foot: Secondary | ICD-10-CM | POA: Diagnosis not present

## 2023-04-26 DIAGNOSIS — U071 COVID-19: Secondary | ICD-10-CM | POA: Diagnosis not present

## 2023-04-26 DIAGNOSIS — R0981 Nasal congestion: Secondary | ICD-10-CM | POA: Diagnosis not present

## 2023-05-31 DIAGNOSIS — M19072 Primary osteoarthritis, left ankle and foot: Secondary | ICD-10-CM | POA: Diagnosis not present

## 2023-05-31 DIAGNOSIS — M19071 Primary osteoarthritis, right ankle and foot: Secondary | ICD-10-CM | POA: Diagnosis not present

## 2023-06-20 DIAGNOSIS — E039 Hypothyroidism, unspecified: Secondary | ICD-10-CM | POA: Diagnosis not present

## 2023-06-20 DIAGNOSIS — D649 Anemia, unspecified: Secondary | ICD-10-CM | POA: Diagnosis not present

## 2023-06-21 DIAGNOSIS — I10 Essential (primary) hypertension: Secondary | ICD-10-CM | POA: Diagnosis not present

## 2023-06-21 DIAGNOSIS — E559 Vitamin D deficiency, unspecified: Secondary | ICD-10-CM | POA: Diagnosis not present

## 2023-06-28 DIAGNOSIS — Z1331 Encounter for screening for depression: Secondary | ICD-10-CM | POA: Diagnosis not present

## 2023-06-28 DIAGNOSIS — I10 Essential (primary) hypertension: Secondary | ICD-10-CM | POA: Diagnosis not present

## 2023-06-28 DIAGNOSIS — R82998 Other abnormal findings in urine: Secondary | ICD-10-CM | POA: Diagnosis not present

## 2023-06-28 DIAGNOSIS — Z1339 Encounter for screening examination for other mental health and behavioral disorders: Secondary | ICD-10-CM | POA: Diagnosis not present

## 2023-06-28 DIAGNOSIS — Z0000 Encounter for general adult medical examination without abnormal findings: Secondary | ICD-10-CM | POA: Diagnosis not present

## 2023-07-04 DIAGNOSIS — M19071 Primary osteoarthritis, right ankle and foot: Secondary | ICD-10-CM | POA: Diagnosis not present

## 2023-07-22 ENCOUNTER — Other Ambulatory Visit (HOSPITAL_BASED_OUTPATIENT_CLINIC_OR_DEPARTMENT_OTHER): Payer: Self-pay | Admitting: Obstetrics & Gynecology

## 2023-07-22 ENCOUNTER — Encounter (HOSPITAL_BASED_OUTPATIENT_CLINIC_OR_DEPARTMENT_OTHER): Payer: Self-pay | Admitting: Obstetrics & Gynecology

## 2023-07-22 ENCOUNTER — Ambulatory Visit (HOSPITAL_BASED_OUTPATIENT_CLINIC_OR_DEPARTMENT_OTHER): Payer: BC Managed Care – PPO | Admitting: Obstetrics & Gynecology

## 2023-07-22 VITALS — BP 130/87 | HR 87 | Ht 65.0 in | Wt 202.0 lb

## 2023-07-22 DIAGNOSIS — I10 Essential (primary) hypertension: Secondary | ICD-10-CM

## 2023-07-22 DIAGNOSIS — Z01419 Encounter for gynecological examination (general) (routine) without abnormal findings: Secondary | ICD-10-CM

## 2023-07-22 DIAGNOSIS — F439 Reaction to severe stress, unspecified: Secondary | ICD-10-CM

## 2023-07-22 DIAGNOSIS — Z780 Asymptomatic menopausal state: Secondary | ICD-10-CM | POA: Diagnosis not present

## 2023-07-22 DIAGNOSIS — E2839 Other primary ovarian failure: Secondary | ICD-10-CM

## 2023-07-22 DIAGNOSIS — Z1231 Encounter for screening mammogram for malignant neoplasm of breast: Secondary | ICD-10-CM

## 2023-07-22 DIAGNOSIS — Z90710 Acquired absence of both cervix and uterus: Secondary | ICD-10-CM

## 2023-07-22 DIAGNOSIS — E559 Vitamin D deficiency, unspecified: Secondary | ICD-10-CM

## 2023-07-22 NOTE — Patient Instructions (Addendum)
[facility] [phone] Dr. Sharee Holster

## 2023-07-22 NOTE — Progress Notes (Signed)
62 y.o. [id number] [name] here for annual exam.  Denies vaginal bleeding.    Had Covid in the summer.    Lots of stressors with work.  Helping to care for parents.  This is also a stressor.    Frustrated with weight.  Admits she isn't working very hard for this.    Patient's last menstrual period was 03/06/2012 (exact date).          Sexually active: Yes.    The current method of family planning is post menopausal status.    Exercising: no Smoker:  no  Health Maintenance: Pap:  07/13/2022 History of abnormal Pap:  +HR HPV 2018 MMG:  06/2021 Colonoscopy:  cologuard 09/23/2022 BMD:   has done with Dr. Link Snuffer Screening Labs: done with Dr. Link Snuffer   reports that she quit smoking about 34 years ago. Her smoking use included cigarettes. She started smoking about 44 years ago. She has a 5 pack-year smoking history. She has never used smokeless tobacco. She reports current alcohol use. She reports that she does not use drugs.  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 30 tablet 0   buPROPion (WELLBUTRIN SR) 150 MG 12 hr tablet Take 150 mg by mouth 2 (two) times daily.   0   olmesartan (BENICAR) 20 MG tablet Take 20  mg by mouth daily.     Vitamin D, Ergocalciferol, (DRISDOL) 50000 units CAPS capsule Take 1 capsule (50,000 Units total) by mouth every Monday. 12 capsule 4   No current facility-administered medications for this visit.    Family History  Problem Relation Age of Onset   Osteoarthritis Mother    Scleroderma Mother    Raynaud syndrome Mother    Melanoma Mother            Arthritis Father    Diabetes Maternal Grandmother    Thyroid disease Maternal Grandmother    Cancer Maternal Grandfather        pancreatic cancer   Drug abuse Son    Healthy Son    Breast cancer Neg Hx     ROS: Constitutional: negative Genitourinary:negative  Exam:   BP 130/87 (BP Location: Right Arm, Patient Position: Sitting, Cuff Size: Normal)   Pulse 87   Ht 5\' 5"  (1.651 m)   Wt 202 lb (91.6 kg)   LMP 03/06/2012 (Exact Date)   BMI 33.61 kg/m   Height: 5\' 5"  (165.1 cm)  General appearance: alert, cooperative and appears stated age Head: Normocephalic, without obvious abnormality, atraumatic Neck: no adenopathy, supple, symmetrical, trachea midline and thyroid normal to inspection and palpation  Lungs: clear to auscultation bilaterally Breasts: normal appearance, no masses or tenderness Heart: regular rate and rhythm Abdomen: soft, non-tender; bowel sounds normal; no masses,  no organomegaly Extremities: extremities normal, atraumatic, no cyanosis or edema Skin: Skin color, texture, turgor normal. No rashes or lesions Lymph nodes: Cervical, supraclavicular, and axillary nodes normal. No abnormal inguinal nodes palpated Neurologic: Grossly normal   Pelvic: External genitalia:  no lesions              Urethra:  normal appearing urethra with no masses, tenderness or lesions              Bartholins and Skenes: normal                 Vagina: normal appearing vagina with normal color and no discharge, no lesions              Cervix: no lesions              Pap taken: No. Bimanual Exam:  Uterus:   normal size, contour, position, consistency, mobility, non-tender              Adnexa: normal adnexa and no mass, fullness, tenderness               Rectovaginal: Confirms               Anus:  normal sphincter tone, no lesions  Chaperone, Ina Homes, CMA, was present for exam.  Assessment/Plan: 1. Well woman exam with routine gynecological exam - Pap smear neg with HR HPV 2023.  Will repeat 2026. - Mammogram ordered.  Last one was 2022. - Colonoscopy declined but did cologuard last year.  Repeat 3 years from that time. - Bone mineral density ordered - lab work done with PCP, Dr. Link Snuffer - vaccines reviewed/updated  2. Postmenopausal - not on HRT  3. Situational stress  4. Primary hypertension - on antihypertensive medication  5. H/O: hysterectomy  6. Encounter for screening mammogram for malignant neoplasm of breast - MM 3D SCREENING MAMMOGRAM BILATERAL BREAST; Future  7. Hypoestrogenism - DG BONE DENSITY (DXA); Future  - will plan repeat Vit D in 3 months

## 2023-08-22 ENCOUNTER — Ambulatory Visit (HOSPITAL_BASED_OUTPATIENT_CLINIC_OR_DEPARTMENT_OTHER)
Admission: RE | Admit: 2023-08-22 | Discharge: 2023-08-22 | Disposition: A | Discharge status: Home / Self Care | Payer: BC Managed Care – PPO | Source: Ambulatory Visit | Attending: Obstetrics & Gynecology | Admitting: Obstetrics & Gynecology

## 2023-08-22 ENCOUNTER — Encounter (HOSPITAL_BASED_OUTPATIENT_CLINIC_OR_DEPARTMENT_OTHER): Payer: Self-pay | Admitting: Radiology

## 2023-08-22 DIAGNOSIS — Z1231 Encounter for screening mammogram for malignant neoplasm of breast: Secondary | ICD-10-CM | POA: Diagnosis not present

## 2023-08-22 DIAGNOSIS — E2839 Other primary ovarian failure: Secondary | ICD-10-CM

## 2023-08-22 DIAGNOSIS — Z780 Asymptomatic menopausal state: Secondary | ICD-10-CM | POA: Diagnosis not present

## 2023-09-02 DIAGNOSIS — I10 Essential (primary) hypertension: Secondary | ICD-10-CM | POA: Diagnosis not present

## 2023-09-26 DIAGNOSIS — N390 Urinary tract infection, site not specified: Secondary | ICD-10-CM | POA: Diagnosis not present

## 2023-10-04 DIAGNOSIS — I10 Essential (primary) hypertension: Secondary | ICD-10-CM | POA: Diagnosis not present

## 2023-10-21 ENCOUNTER — Other Ambulatory Visit (HOSPITAL_BASED_OUTPATIENT_CLINIC_OR_DEPARTMENT_OTHER): Payer: BC Managed Care – PPO

## 2023-10-21 DIAGNOSIS — E559 Vitamin D deficiency, unspecified: Secondary | ICD-10-CM | POA: Diagnosis not present

## 2023-10-22 ENCOUNTER — Encounter (HOSPITAL_BASED_OUTPATIENT_CLINIC_OR_DEPARTMENT_OTHER): Payer: Self-pay | Admitting: Obstetrics & Gynecology

## 2023-10-22 LAB — VITAMIN D 25 HYDROXY (VIT D DEFICIENCY, FRACTURES): Vit D, 25-Hydroxy: 51.4 ng/mL (ref 30.0–100.0)

## 2023-11-04 DIAGNOSIS — M19072 Primary osteoarthritis, left ankle and foot: Secondary | ICD-10-CM | POA: Diagnosis not present

## 2023-11-04 DIAGNOSIS — M79671 Pain in right foot: Secondary | ICD-10-CM | POA: Diagnosis not present

## 2023-11-04 DIAGNOSIS — M19071 Primary osteoarthritis, right ankle and foot: Secondary | ICD-10-CM | POA: Diagnosis not present

## 2023-11-04 DIAGNOSIS — M79672 Pain in left foot: Secondary | ICD-10-CM | POA: Diagnosis not present

## 2024-06-27 DIAGNOSIS — E559 Vitamin D deficiency, unspecified: Secondary | ICD-10-CM | POA: Diagnosis not present

## 2024-06-27 DIAGNOSIS — R946 Abnormal results of thyroid function studies: Secondary | ICD-10-CM | POA: Diagnosis not present

## 2024-06-27 DIAGNOSIS — D649 Anemia, unspecified: Secondary | ICD-10-CM | POA: Diagnosis not present

## 2024-06-27 DIAGNOSIS — I10 Essential (primary) hypertension: Secondary | ICD-10-CM | POA: Diagnosis not present

## 2024-07-04 ENCOUNTER — Other Ambulatory Visit: Payer: Self-pay | Admitting: Internal Medicine

## 2024-07-04 DIAGNOSIS — Z1331 Encounter for screening for depression: Secondary | ICD-10-CM | POA: Diagnosis not present

## 2024-07-04 DIAGNOSIS — E669 Obesity, unspecified: Secondary | ICD-10-CM | POA: Diagnosis not present

## 2024-07-04 DIAGNOSIS — Z1339 Encounter for screening examination for other mental health and behavioral disorders: Secondary | ICD-10-CM | POA: Diagnosis not present

## 2024-07-04 DIAGNOSIS — E785 Hyperlipidemia, unspecified: Secondary | ICD-10-CM

## 2024-07-04 DIAGNOSIS — Z23 Encounter for immunization: Secondary | ICD-10-CM | POA: Diagnosis not present

## 2024-07-17 ENCOUNTER — Ambulatory Visit
Admission: RE | Admit: 2024-07-17 | Discharge: 2024-07-17 | Disposition: A | Discharge status: Home / Self Care | Source: Ambulatory Visit | Attending: Internal Medicine | Admitting: Internal Medicine

## 2024-07-17 DIAGNOSIS — E785 Hyperlipidemia, unspecified: Secondary | ICD-10-CM

## 2024-07-30 ENCOUNTER — Ambulatory Visit (HOSPITAL_BASED_OUTPATIENT_CLINIC_OR_DEPARTMENT_OTHER): Payer: BC Managed Care – PPO | Admitting: Obstetrics & Gynecology

## 2024-07-31 ENCOUNTER — Encounter (HOSPITAL_BASED_OUTPATIENT_CLINIC_OR_DEPARTMENT_OTHER): Payer: Self-pay | Admitting: Obstetrics & Gynecology

## 2024-07-31 ENCOUNTER — Ambulatory Visit (INDEPENDENT_AMBULATORY_CARE_PROVIDER_SITE_OTHER): Admitting: Obstetrics & Gynecology

## 2024-07-31 VITALS — BP 110/74 | HR 84 | Ht 65.0 in | Wt 208.0 lb

## 2024-07-31 DIAGNOSIS — N8110 Cystocele, unspecified: Secondary | ICD-10-CM

## 2024-07-31 DIAGNOSIS — Z1331 Encounter for screening for depression: Secondary | ICD-10-CM | POA: Diagnosis not present

## 2024-07-31 DIAGNOSIS — I10 Essential (primary) hypertension: Secondary | ICD-10-CM

## 2024-07-31 DIAGNOSIS — Z01419 Encounter for gynecological examination (general) (routine) without abnormal findings: Secondary | ICD-10-CM | POA: Diagnosis not present

## 2024-07-31 DIAGNOSIS — Z90710 Acquired absence of both cervix and uterus: Secondary | ICD-10-CM

## 2024-07-31 DIAGNOSIS — E559 Vitamin D deficiency, unspecified: Secondary | ICD-10-CM | POA: Diagnosis not present

## 2024-07-31 NOTE — Progress Notes (Signed)
   ANNUAL EXAM Patient name: Meagan Harrington MRN [id number]  Date of birth: 1961/02/23 Chief Complaint:   Gynecologic Exam  History of Present Illness:   Meagan Harrington is a 63 y.o. G49P2012 Caucasian female being seen today for a routine annual exam.  Denies vaginal bleeding.    Had coronary CT this year.  Calcium score was 0.  Fatty liver was noted.  Has started Zepbound.    Patient's last menstrual period was 03/06/2012 (exact date).   Last pap: Hysterectomy. 07/13/2022. Results were: NILM w/ HRHPV negative. H/O abnormal pap: no Last mammogram: 08/22/2023. Results were: normal. Family h/o breast cancer: no Last colonoscopy: Declined colonoscopy.  09/23/2022 Cologuard. Results were: normal. Family h/o colorectal cancer: no     07/22/2023   10:12 AM 07/13/2022   11:13 AM 07/13/2021    8:18 AM  Depression screen PHQ 2/9  Decreased Interest 0 0 0  Down, Depressed, Hopeless 0 0 0  PHQ - 2 Score 0 0 0     Review of Systems:   Pertinent items are noted in HPI Denies any urinary or bowel changes.  Denies pelvic pain.   Pertinent History Reviewed:  Reviewed past medical,surgical, social and family history.  Reviewed problem list, medications and allergies. Physical Assessment:   Vitals:   07/31/24 1335  BP: 110/74  Pulse: 84  SpO2: 97%  Weight: 208 lb (94.3 kg)  Height: 5' 5 (1.651 m)  Body mass index is 34.61 kg/m.        Physical Examination:   General appearance - well appearing, and in no distress  Mental status - alert, oriented to person, place, and time  Psych:  She has a normal mood and affect  Skin - warm and dry, normal color, no suspicious lesions noted  Chest - effort normal, all lung fields clear to auscultation bilaterally  Heart - normal rate and regular rhythm  Neck:  midline trachea, no thyromegaly or nodules  Breasts - breasts appear normal, no suspicious masses, no skin or nipple changes or  axillary nodes  Abdomen - soft, nontender,  nondistended, no masses or organomegaly  Pelvic - VULVA: normal appearing vulva with no masses, tenderness or lesions   VAGINA: normal appearing vagina with normal color and discharge, no lesions   CERVIX: surgically absent  Thin prep pap is not indicated  UTERUS: surgically absent  ADNEXA: No adnexal masses or tenderness noted.  Rectal - normal rectal, good sphincter tone, no masses felt.  Extremities:  No swelling or varicosities noted  Chaperone present for exam  No results found for this or any previous visit (from the past 24 hours).  Assessment & Plan:  1. Well woman exam with routine gynecological exam (Primary) - Pap smear not indicated - Mammogram 07/2023 - Colonoscopy declined but had cologuard 2023.   - Bone mineral density normal 2024.   - lab work done with PCP, Dr. Brigitte - vaccines reviewed/updated  2. H/O: hysterectomy  3. Primary hypertension - on hydrochlorothiazide and olmesartan  4.  Vit D def - taking Vit D supplement  5. Female cystocele - findings discussed.  Questions answered.     No orders of the defined types were placed in this encounter.   Meds: No orders of the defined types were placed in this encounter.   Follow-up: Return in about 1 year (around 07/31/2025).  Ronal GORMAN Pinal, MD 07/31/2024 2:12 PM

## 2024-08-08 DIAGNOSIS — M19071 Primary osteoarthritis, right ankle and foot: Secondary | ICD-10-CM | POA: Diagnosis not present

## 2024-08-08 DIAGNOSIS — M19072 Primary osteoarthritis, left ankle and foot: Secondary | ICD-10-CM | POA: Diagnosis not present
